# Patient Record
Sex: Female | Born: 1965 | Race: White | Hispanic: No | Marital: Married | Smoking: Never smoker
Health system: Southern US, Community
[De-identification: ages and names within clinical notes are randomized; demographics above are authoritative.]

## PROBLEM LIST (undated history)

## (undated) DIAGNOSIS — I10 Essential (primary) hypertension: Secondary | ICD-10-CM

## (undated) DIAGNOSIS — I491 Atrial premature depolarization: Secondary | ICD-10-CM

## (undated) DIAGNOSIS — I119 Hypertensive heart disease without heart failure: Secondary | ICD-10-CM

## (undated) DIAGNOSIS — C539 Malignant neoplasm of cervix uteri, unspecified: Secondary | ICD-10-CM

## (undated) HISTORY — DX: Atrial premature depolarization: I49.1

## (undated) HISTORY — PX: APPENDECTOMY: SHX54

## (undated) HISTORY — DX: Malignant neoplasm of cervix uteri, unspecified: C53.9

## (undated) HISTORY — DX: Hypertensive heart disease without heart failure: I11.9

## (undated) HISTORY — PX: ABDOMINAL HYSTERECTOMY: SHX81

## (undated) HISTORY — PX: SPIDER VEIN INJECTION: SHX783

## (undated) HISTORY — PX: OOPHORECTOMY: SHX86

## (undated) HISTORY — DX: Essential (primary) hypertension: I10

---

## 1998-07-11 ENCOUNTER — Other Ambulatory Visit: Admission: RE | Admit: 1998-07-11 | Discharge: 1998-07-11 | Payer: Self-pay | Admitting: *Deleted

## 1999-02-07 ENCOUNTER — Other Ambulatory Visit: Admission: RE | Admit: 1999-02-07 | Discharge: 1999-02-07 | Payer: Self-pay | Admitting: *Deleted

## 1999-09-06 ENCOUNTER — Inpatient Hospital Stay (HOSPITAL_COMMUNITY): Admission: AD | Admit: 1999-09-06 | Discharge: 1999-09-06 | Payer: Self-pay | Admitting: *Deleted

## 1999-09-07 ENCOUNTER — Inpatient Hospital Stay (HOSPITAL_COMMUNITY): Admission: AD | Admit: 1999-09-07 | Discharge: 1999-09-09 | Payer: Self-pay | Admitting: Obstetrics and Gynecology

## 1999-10-10 ENCOUNTER — Other Ambulatory Visit: Admission: RE | Admit: 1999-10-10 | Discharge: 1999-10-10 | Payer: Self-pay | Admitting: *Deleted

## 2001-10-05 ENCOUNTER — Other Ambulatory Visit: Admission: RE | Admit: 2001-10-05 | Discharge: 2001-10-05 | Payer: Self-pay | Admitting: *Deleted

## 2002-06-07 ENCOUNTER — Other Ambulatory Visit: Admission: RE | Admit: 2002-06-07 | Discharge: 2002-06-07 | Payer: Self-pay | Admitting: *Deleted

## 2002-09-03 ENCOUNTER — Encounter: Payer: Self-pay | Admitting: Obstetrics and Gynecology

## 2002-09-03 ENCOUNTER — Inpatient Hospital Stay (HOSPITAL_COMMUNITY): Admission: AD | Admit: 2002-09-03 | Discharge: 2002-09-03 | Payer: Self-pay | Admitting: Obstetrics and Gynecology

## 2002-09-06 ENCOUNTER — Ambulatory Visit (HOSPITAL_COMMUNITY): Admission: RE | Admit: 2002-09-06 | Discharge: 2002-09-06 | Payer: Self-pay | Admitting: *Deleted

## 2002-09-06 ENCOUNTER — Encounter (INDEPENDENT_AMBULATORY_CARE_PROVIDER_SITE_OTHER): Payer: Self-pay | Admitting: *Deleted

## 2002-09-13 ENCOUNTER — Inpatient Hospital Stay (HOSPITAL_COMMUNITY): Admission: AD | Admit: 2002-09-13 | Discharge: 2002-09-13 | Payer: Self-pay | Admitting: Obstetrics and Gynecology

## 2003-04-02 ENCOUNTER — Encounter: Payer: Self-pay | Admitting: *Deleted

## 2003-04-02 ENCOUNTER — Inpatient Hospital Stay (HOSPITAL_COMMUNITY): Admission: AD | Admit: 2003-04-02 | Discharge: 2003-04-02 | Payer: Self-pay | Admitting: Obstetrics and Gynecology

## 2003-09-01 ENCOUNTER — Other Ambulatory Visit: Admission: RE | Admit: 2003-09-01 | Discharge: 2003-09-01 | Payer: Self-pay | Admitting: *Deleted

## 2004-09-03 ENCOUNTER — Ambulatory Visit: Payer: Self-pay | Admitting: Internal Medicine

## 2004-09-16 ENCOUNTER — Other Ambulatory Visit: Admission: RE | Admit: 2004-09-16 | Discharge: 2004-09-16 | Payer: Self-pay | Admitting: Obstetrics and Gynecology

## 2004-09-27 ENCOUNTER — Ambulatory Visit: Payer: Self-pay | Admitting: Internal Medicine

## 2004-10-07 ENCOUNTER — Other Ambulatory Visit: Admission: RE | Admit: 2004-10-07 | Discharge: 2004-10-07 | Payer: Self-pay | Admitting: Obstetrics and Gynecology

## 2004-11-06 ENCOUNTER — Ambulatory Visit (HOSPITAL_COMMUNITY): Admission: RE | Admit: 2004-11-06 | Discharge: 2004-11-06 | Payer: Self-pay | Admitting: Obstetrics and Gynecology

## 2004-11-06 ENCOUNTER — Encounter (INDEPENDENT_AMBULATORY_CARE_PROVIDER_SITE_OTHER): Payer: Self-pay | Admitting: *Deleted

## 2004-11-16 ENCOUNTER — Observation Stay (HOSPITAL_COMMUNITY): Admission: AD | Admit: 2004-11-16 | Discharge: 2004-11-17 | Payer: Self-pay | Admitting: Obstetrics and Gynecology

## 2004-11-19 ENCOUNTER — Ambulatory Visit: Admission: RE | Admit: 2004-11-19 | Discharge: 2004-11-19 | Payer: Self-pay | Admitting: Gynecology

## 2004-12-11 ENCOUNTER — Encounter: Admission: RE | Admit: 2004-12-11 | Discharge: 2004-12-11 | Payer: Self-pay | Admitting: Obstetrics and Gynecology

## 2005-03-18 ENCOUNTER — Other Ambulatory Visit: Admission: RE | Admit: 2005-03-18 | Discharge: 2005-03-18 | Payer: Self-pay | Admitting: Gynecology

## 2005-03-18 ENCOUNTER — Ambulatory Visit: Admission: RE | Admit: 2005-03-18 | Discharge: 2005-03-18 | Payer: Self-pay | Admitting: Gynecology

## 2005-03-18 ENCOUNTER — Encounter (INDEPENDENT_AMBULATORY_CARE_PROVIDER_SITE_OTHER): Payer: Self-pay | Admitting: Specialist

## 2005-03-18 ENCOUNTER — Encounter (INDEPENDENT_AMBULATORY_CARE_PROVIDER_SITE_OTHER): Payer: Self-pay | Admitting: *Deleted

## 2005-06-11 ENCOUNTER — Encounter: Admission: RE | Admit: 2005-06-11 | Discharge: 2005-06-11 | Payer: Self-pay | Admitting: Obstetrics and Gynecology

## 2005-07-02 ENCOUNTER — Other Ambulatory Visit: Admission: RE | Admit: 2005-07-02 | Discharge: 2005-07-02 | Payer: Self-pay | Admitting: Obstetrics and Gynecology

## 2005-10-07 ENCOUNTER — Ambulatory Visit: Admission: RE | Admit: 2005-10-07 | Discharge: 2005-10-07 | Payer: Self-pay | Admitting: Gynecologic Oncology

## 2005-10-07 ENCOUNTER — Other Ambulatory Visit: Admission: RE | Admit: 2005-10-07 | Discharge: 2005-10-07 | Payer: Self-pay | Admitting: Gynecologic Oncology

## 2005-10-07 ENCOUNTER — Encounter (INDEPENDENT_AMBULATORY_CARE_PROVIDER_SITE_OTHER): Payer: Self-pay | Admitting: *Deleted

## 2005-12-17 ENCOUNTER — Encounter: Admission: RE | Admit: 2005-12-17 | Discharge: 2005-12-17 | Payer: Self-pay | Admitting: Obstetrics and Gynecology

## 2006-01-08 ENCOUNTER — Other Ambulatory Visit: Admission: RE | Admit: 2006-01-08 | Discharge: 2006-01-08 | Payer: Self-pay | Admitting: Obstetrics and Gynecology

## 2006-02-05 ENCOUNTER — Inpatient Hospital Stay (HOSPITAL_COMMUNITY): Admission: RE | Admit: 2006-02-05 | Discharge: 2006-02-07 | Payer: Self-pay | Admitting: Obstetrics and Gynecology

## 2006-02-05 ENCOUNTER — Encounter (INDEPENDENT_AMBULATORY_CARE_PROVIDER_SITE_OTHER): Payer: Self-pay | Admitting: Specialist

## 2006-05-27 ENCOUNTER — Ambulatory Visit: Admission: RE | Admit: 2006-05-27 | Discharge: 2006-05-27 | Payer: Self-pay | Admitting: Gynecologic Oncology

## 2006-05-27 ENCOUNTER — Encounter (INDEPENDENT_AMBULATORY_CARE_PROVIDER_SITE_OTHER): Payer: Self-pay | Admitting: *Deleted

## 2006-05-27 ENCOUNTER — Other Ambulatory Visit: Admission: RE | Admit: 2006-05-27 | Discharge: 2006-05-27 | Payer: Self-pay | Admitting: Gynecologic Oncology

## 2007-02-16 ENCOUNTER — Other Ambulatory Visit: Admission: RE | Admit: 2007-02-16 | Discharge: 2007-02-16 | Payer: Self-pay | Admitting: Gynecologic Oncology

## 2007-02-16 ENCOUNTER — Ambulatory Visit: Admission: RE | Admit: 2007-02-16 | Discharge: 2007-02-16 | Payer: Self-pay | Admitting: Gynecologic Oncology

## 2007-02-16 ENCOUNTER — Encounter (INDEPENDENT_AMBULATORY_CARE_PROVIDER_SITE_OTHER): Payer: Self-pay | Admitting: Specialist

## 2007-11-24 ENCOUNTER — Ambulatory Visit: Admission: RE | Admit: 2007-11-24 | Discharge: 2007-11-24 | Payer: Self-pay | Admitting: Gynecologic Oncology

## 2007-11-24 ENCOUNTER — Other Ambulatory Visit: Admission: RE | Admit: 2007-11-24 | Discharge: 2007-11-24 | Payer: Self-pay | Admitting: Gynecologic Oncology

## 2007-11-24 ENCOUNTER — Encounter: Payer: Self-pay | Admitting: Gynecologic Oncology

## 2008-03-14 ENCOUNTER — Encounter: Admission: RE | Admit: 2008-03-14 | Discharge: 2008-03-14 | Payer: Self-pay | Admitting: Obstetrics and Gynecology

## 2008-05-10 ENCOUNTER — Other Ambulatory Visit: Admission: RE | Admit: 2008-05-10 | Discharge: 2008-05-10 | Payer: Self-pay | Admitting: Gynecology

## 2008-05-10 ENCOUNTER — Ambulatory Visit: Admission: RE | Admit: 2008-05-10 | Discharge: 2008-05-10 | Payer: Self-pay | Admitting: Gynecology

## 2008-05-10 ENCOUNTER — Encounter: Payer: Self-pay | Admitting: Gynecology

## 2008-07-27 ENCOUNTER — Observation Stay (HOSPITAL_COMMUNITY): Admission: EM | Admit: 2008-07-27 | Discharge: 2008-07-30 | Payer: Self-pay | Admitting: Family Medicine

## 2008-08-03 ENCOUNTER — Emergency Department (HOSPITAL_COMMUNITY): Admission: EM | Admit: 2008-08-03 | Discharge: 2008-08-03 | Payer: Self-pay | Admitting: Emergency Medicine

## 2008-12-18 ENCOUNTER — Ambulatory Visit: Payer: Self-pay | Admitting: Gastroenterology

## 2008-12-18 DIAGNOSIS — K56609 Unspecified intestinal obstruction, unspecified as to partial versus complete obstruction: Secondary | ICD-10-CM | POA: Insufficient documentation

## 2009-03-15 ENCOUNTER — Encounter: Admission: RE | Admit: 2009-03-15 | Discharge: 2009-03-15 | Payer: Self-pay | Admitting: Obstetrics and Gynecology

## 2009-06-07 ENCOUNTER — Other Ambulatory Visit: Admission: RE | Admit: 2009-06-07 | Discharge: 2009-06-07 | Payer: Self-pay | Admitting: Gynecologic Oncology

## 2009-06-07 ENCOUNTER — Ambulatory Visit: Admission: RE | Admit: 2009-06-07 | Discharge: 2009-06-07 | Payer: Self-pay | Admitting: Gynecologic Oncology

## 2009-06-07 ENCOUNTER — Encounter: Payer: Self-pay | Admitting: Gynecologic Oncology

## 2010-03-18 ENCOUNTER — Encounter: Admission: RE | Admit: 2010-03-18 | Discharge: 2010-03-18 | Payer: Self-pay | Admitting: Obstetrics and Gynecology

## 2010-04-03 ENCOUNTER — Telehealth: Payer: Self-pay | Admitting: Gastroenterology

## 2010-04-25 ENCOUNTER — Ambulatory Visit: Payer: Self-pay | Admitting: Gastroenterology

## 2010-04-25 DIAGNOSIS — K921 Melena: Secondary | ICD-10-CM | POA: Insufficient documentation

## 2010-05-12 ENCOUNTER — Emergency Department (HOSPITAL_COMMUNITY): Admission: EM | Admit: 2010-05-12 | Discharge: 2010-05-12 | Payer: Self-pay | Admitting: Family Medicine

## 2010-05-12 ENCOUNTER — Emergency Department (HOSPITAL_COMMUNITY): Admission: EM | Admit: 2010-05-12 | Discharge: 2010-05-12 | Payer: Self-pay | Admitting: Emergency Medicine

## 2010-05-21 ENCOUNTER — Ambulatory Visit: Payer: Self-pay | Admitting: Gastroenterology

## 2010-11-26 NOTE — Letter (Signed)
Summary: [facility] Instructions  [facility]  [address] [place], [place] [address]   Phone: [phone]  Fax: [phone]       PRICSILLA [name]    [birth year]    MRN: [id number]        Procedure Day /Date: Tuesday July 26th, 2011     Arrival Time: 2:30pm     Procedure Time: 3:30pm     Location of Procedure:                    _x _  [facility] (4th Floor)                        PREPARATION FOR COLONOSCOPY WITH MOVIPREP   Starting 5 days prior to your procedure 05/16/10 do not eat nuts, seeds, popcorn, corn, beans, peas,  salads, or any raw vegetables.  Do not take any fiber supplements (e.g. Metamucil, Citrucel, and Benefiber).  THE DAY BEFORE YOUR PROCEDURE         DATE: 05/20/10  DAY: Monday  1.  Drink clear liquids the entire day-NO SOLID FOOD  2.  Do not drink anything colored red or purple.  Avoid juices with pulp.  No orange juice.  3.  Drink at least 64 oz. (8 glasses) of fluid/clear liquids during the day to prevent dehydration and help the prep work efficiently.  CLEAR LIQUIDS INCLUDE: Water Jello Ice Popsicles Tea (sugar ok, no milk/cream) Powdered fruit flavored drinks Coffee (sugar ok, no milk/cream) Gatorade Juice: apple, white grape, white cranberry  Lemonade Clear bullion, consomm, broth Carbonated beverages (any kind) Strained chicken noodle soup Hard Candy                             4.  In the morning, mix first dose of MoviPrep solution:    Empty 1 Pouch A and 1 Pouch B into the disposable container    Add lukewarm drinking water to the top line of the container. Mix to dissolve    Refrigerate (mixed solution should be used within 24 hrs)  5.  Begin drinking the prep at 5:00 p.m. The MoviPrep container is divided by 4 marks.   Every 15 minutes drink the solution down to the next mark (approximately 8 oz) until the full liter is complete.   6.  Follow completed prep with 16 oz of clear liquid of your choice  (Nothing red or purple).  Continue to drink clear liquids until bedtime.  7.  Before going to bed, mix second dose of MoviPrep solution:    Empty 1 Pouch A and 1 Pouch B into the disposable container    Add lukewarm drinking water to the top line of the container. Mix to dissolve    Refrigerate  THE DAY OF YOUR PROCEDURE      DATE: 05/21/10 DAY: Tuesday  Beginning at 10:30 a.m. (5 hours before procedure):         1. Every 15 minutes, drink the solution down to the next mark (approx 8 oz) until the full liter is complete.  2. Follow completed prep with 16 oz. of clear liquid of your choice.    3. You may drink clear liquids until 1:30pm (2 HOURS BEFORE PROCEDURE).   MEDICATION INSTRUCTIONS  Unless otherwise instructed, you should take regular prescription medications with a small sip of water   as early as possible the morning of your  procedure.         OTHER INSTRUCTIONS  You will need a responsible adult at least 43 years of age to accompany you and drive you home.   This person must remain in the waiting room during your procedure.  Wear loose fitting clothing that is easily removed.  Leave jewelry and other valuables at home.  However, you may wish to bring a book to read or  an iPod/MP3 player to listen to music as you wait for your procedure to start.  Remove all body piercing jewelry and leave at home.  Total time from sign-in until discharge is approximately 2-3 hours.  You should go home directly after your procedure and rest.  You can resume normal activities the  day after your procedure.  The day of your procedure you should not:   Drive   Make legal decisions   Operate machinery   Drink alcohol   Return to work  You will receive specific instructions about eating, activities and medications before you leave.    The above instructions have been reviewed and explained to me by   Estill Bamberg.     I fully understand and can verbalize these  instructions _____________________________ Date _________

## 2010-11-26 NOTE — Procedures (Signed)
Summary: Colonoscopy  Patient: Ardath Lepak Note: All result statuses are Final unless otherwise noted.  Tests: (1) Colonoscopy (COL)   COL Colonoscopy           DONE     [facility]     [address]     [place], [place]  [address]           COLONOSCOPY PROCEDURE REPORT           PATIENT:  Natika, Geyer  MR#:  [id number]     BIRTHDATE:  01-Feb-1966, 44 yrs. old  GENDER:  female     ENDOSCOPIST:  Judie Petit T. Russella Dar, MD, [facility]           PROCEDURE DATE:  05/21/2010     PROCEDURE:  Colonoscopy 13244     ASA CLASS:  Class II     INDICATIONS:  1) hematochezia  2) family Hx of polyps     MEDICATIONS:   Fentanyl 75 mcg IV, Versed 9 mg IV     DESCRIPTION OF PROCEDURE:   After the risks benefits and     alternatives of the procedure were thoroughly explained, informed     consent was obtained.  Digital rectal exam was performed and     revealed no abnormalities.   The LB [id number]-[id number] endoscope     was introduced through the anus and advanced to the cecum, which     was identified by both the appendix and ileocecal valve, without     limitations.  The quality of the prep was good, using MoviPrep.     The instrument was then slowly withdrawn as the colon was fully     examined.     <<PROCEDUREIMAGES>>     FINDINGS:  A normal appearing cecum, ileocecal valve, and     appendiceal orifice were identified. The ascending, hepatic     flexure, transverse, splenic flexure, descending, sigmoid colon,     and rectum appeared unremarkable. Retroflexed views in the rectum     revealed no abnormalities. The time to cecum =  7  minutes. The     scope was then withdrawn (time =  11.75  min) from the patient and     the procedure completed.     COMPLICATIONS:  None           ENDOSCOPIC IMPRESSION:     1) Normal colon           RECOMMENDATIONS:     1) Repeat Colonscopy at age 52, 03/2018.           Venita Lick. Russella Dar, MD, [facility]           CC: Harold Hedge, MD           n.  Rosalie DoctorVenita Lick. Keala Drum at 05/21/2010 02:47 PM           Atlee Abide, [id number]  Note: An exclamation mark (!) indicates a result that was not dispersed into the flowsheet. Document Creation Date: 05/21/2010 2:47 PM _______________________________________________________________________  (1) Order result status: Final Collection or observation date-time: 05/21/2010 14:42 Requested date-time:  Receipt date-time:  Reported date-time:  Referring Physician:   Ordering Physician: Claudette Head [phone]) Specimen Source:  Source: Launa Grill Order Number: [id number] Lab site:   Appended Document: Colonoscopy    Clinical Lists Changes  Observations: Added new observation of COLONNXTDUE: 03/2018 (05/21/2010 15:18)

## 2010-11-26 NOTE — Assessment & Plan Note (Signed)
Summary: blood in the stool/Courtney [name]   History of Present Illness Visit Type: Follow-up Visit Primary GI MD: Elie Goody MD [facility] Primary Provider: Harold Hedge, MD Chief Complaint: BRB in toilet for 2-3 days, some contipation prior History of Present Illness:   This is a 45 year old woman who notes a small amount of bright red blood per rectum associated with bowel movements for 2-3 days. Resolved spontaneously. She noted mild constipation prior to the onset of bleeding. She has a strong family history of colon polyps and a personal history of cervical adenocarcinoma and she had been previously recommended to undergo colonoscopy last year.   GI Review of Systems      Denies abdominal pain, acid reflux, belching, bloating, chest pain, dysphagia with liquids, dysphagia with solids, heartburn, loss of appetite, nausea, vomiting, vomiting blood, weight loss, and  weight gain.      Reports constipation and  rectal bleeding.     Denies anal fissure, black tarry stools, change in bowel habit, diarrhea, diverticulosis, fecal incontinence, heme positive stool, hemorrhoids, irritable bowel syndrome, jaundice, light color stool, liver problems, and  rectal pain.   Current Medications (verified): 1)  Vitamin D 500 Iu .... Take 1 Tablet By Mouth Once Daily 2)  Fish Oil 1000 Mg Caps (Omega-3 Fatty Acids) .... Take 1 Tablet By Mouth Once A Day 3)  Multivitamins  Tabs (Multiple Vitamin) .... Take 1 Tablet By Mouth Once A Day 4)  Calcium 500 Mg Tabs (Calcium Carbonate) .... Take 1 Tablet By Mouth Once Daily 5)  Colace 100 Mg Caps (Docusate Sodium) .... Once Daily  Allergies (verified): 1)  ! Sulfa  Past History:  Past Medical History: Cervical adenocarcinoma in situ, 2006 Kidney Stones Small Bowel Obstruction, 07/2008  Past Surgical History: Reviewed history from 12/18/2008 and no changes required. Hysterectomy, 2006 Appendectomy, 2007 Left oophorectomy, 2007  Family History: Reviewed  history from 12/18/2008 and no changes required. Family History of Breast Cancer: Mother No FH of Colon Cancer: Family History of Colon Polyps: Father, Paternal Grandfather  Social History: Reviewed history from 12/18/2008 and no changes required. married Patient has never smoked.  Alcohol Use - yes-5-7 glasses per week Daily Caffeine Use- 1 cup daily Illicit Drug Use - no Patient gets regular exercise.  Review of Systems       The patient complains of allergy/sinus and anxiety-new.  The patient denies anemia, arthritis/joint pain, back pain, blood in urine, breast changes/lumps, change in vision, confusion, cough, coughing up blood, depression-new, fainting, fatigue, fever, headaches-new, hearing problems, heart murmur, heart rhythm changes, itching, menstrual pain, muscle pains/cramps, night sweats, nosebleeds, pregnancy symptoms, shortness of breath, skin rash, sleeping problems, sore throat, swelling of feet/legs, swollen lymph glands, thirst - excessive , urination - excessive , urination changes/pain, urine leakage, vision changes, and voice change.    Vital Signs:  Patient profile:   45 year old female Height:      62 inches Weight:      119 pounds BMI:     21.84 Pulse rate:   60 / minute Pulse rhythm:   regular BP sitting:   110 / 80  (left arm) Cuff size:   regular  Vitals Entered By: June McMurray CMA Duncan Dull) (April 25, 2010 10:34 AM)  Physical Exam  General:  Well developed, well nourished, no acute distress. Head:  Normocephalic and atraumatic. Eyes:  PERRLA, no icterus. Mouth:  No deformity or lesions, dentition normal. Lungs:  Clear throughout to auscultation. Heart:  Regular rate and rhythm;  no murmurs, rubs,  or bruits. Abdomen:  Soft, nontender and nondistended. No masses, hepatosplenomegaly or hernias noted. Normal bowel sounds. Rectal:  Normal exam. hemocult negative.   Extremities:  No clubbing, cyanosis, edema or deformities noted. Neurologic:  Alert and   oriented x4;  grossly normal neurologically. Psych:  Alert and cooperative. Normal mood and affect.  Impression & Recommendations:  Problem # 1:  BLOOD IN STOOL (ICD-578.1) Small-volume hematochezia, which has subsequently resolved. Family history of colon polyps, and a personal history of cervical adenocarcinoma in situ. The risks, benefits and alternatives to colonoscopy with possible biopsy, possible destruction of hemorrhoids and possible polypectomy were discussed with the patient and they consent to proceed. The procedure will be scheduled electively. Orders: Colonoscopy (Colon)  Problem # 2:  FAMILY HISTORY COLONIC POLYPS (ICD-V18.51) Father and paternal grandfather. See problem #1. Orders: Colonoscopy (Colon)  Patient Instructions: 1)  Colonoscopy brochure given.  2)  Pick up your prep from your pharmacy.  3)  Copy sent to : Harold Hedge, MD 4)  The medication list was reviewed and reconciled.  All changed / newly prescribed medications were explained.  A complete medication list was provided to the patient / caregiver.  Prescriptions: MOVIPREP 100 GM  SOLR (PEG-KCL-NACL-NASULF-NA ASC-C) As per prep instructions.  #1 x 0   Entered by:   Christie Nottingham CMA (AAMA)   Authorized by:   Meryl Dare MD [facility]   Signed by:   Meryl Dare MD [facility] on 04/25/2010   Method used:   Electronically to        [facility] (retail)       [address]       [place], [place]  [address]       Ph: [phone] or [phone]       Fax: [phone]   RxID:   [id number]

## 2010-11-26 NOTE — Progress Notes (Signed)
Summary: triage  Phone Note Call from Patient Call back at [phone]   Caller: Patient Call For: Dr. Russella Dar Reason for Call: Talk to Nurse Summary of Call: pt has noticed a small amount of blood on stool and doesnt feel comfortable waiting until next available appt in August... ok to see Amy or Gunnar Fusi if needed Initial call taken by: Vallarie Mare,  April 03, 2010 9:09 AM  Follow-up for Phone Call        patient has notified bright red blood on the outside of her stool.  She does states she has been constipated.  I have scheduled her for an appointment with Dr Russella Dar for 04/25/10 at 9:30, I have also advised her to start on a stool softener and a high fiber diet. Follow-up by: Darcey Nora RN, CGRN,  April 03, 2010 9:40 AM

## 2011-01-11 LAB — CBC
HCT: 38.3 % (ref 36.0–46.0)
MCV: 98.1 fL (ref 78.0–100.0)
RBC: 3.9 MIL/uL (ref 3.87–5.11)
RDW: 12.4 % (ref 11.5–15.5)

## 2011-01-11 LAB — COMPREHENSIVE METABOLIC PANEL
ALT: 12 U/L (ref 0–35)
CO2: 22 mEq/L (ref 19–32)
GFR calc Af Amer: >60 mL/min (ref >60)
Total Protein: 7.3 g/dL (ref 6.0–8.3)

## 2011-01-11 LAB — POCT CARDIAC MARKERS
Myoglobin, poc: 54.9 ng/mL (ref 12–200)
Troponin i, poc: <0.05 ng/mL (ref 0.00–0.09)

## 2011-01-11 LAB — DIFFERENTIAL
Basophils Absolute: 0 10*3/uL (ref 0.0–0.1)
Basophils Relative: 0 % (ref 0–1)
Eosinophils Relative: 1 % (ref 0–5)
Monocytes Absolute: 0.5 10*3/uL (ref 0.1–1.0)
Monocytes Relative: 6 % (ref 3–12)

## 2011-01-27 ENCOUNTER — Ambulatory Visit (INDEPENDENT_AMBULATORY_CARE_PROVIDER_SITE_OTHER): Payer: Managed Care, Other (non HMO) | Admitting: Professional

## 2011-01-27 DIAGNOSIS — F4322 Adjustment disorder with anxiety: Secondary | ICD-10-CM

## 2011-02-03 ENCOUNTER — Ambulatory Visit (INDEPENDENT_AMBULATORY_CARE_PROVIDER_SITE_OTHER): Payer: Managed Care, Other (non HMO) | Admitting: Professional

## 2011-02-03 DIAGNOSIS — F4322 Adjustment disorder with anxiety: Secondary | ICD-10-CM

## 2011-03-03 ENCOUNTER — Other Ambulatory Visit: Payer: Self-pay | Admitting: Obstetrics and Gynecology

## 2011-03-03 DIAGNOSIS — Z1231 Encounter for screening mammogram for malignant neoplasm of breast: Secondary | ICD-10-CM

## 2011-03-11 NOTE — H&P (Signed)
Courtney [name], Courtney [name]              ACCOUNT NO.:  [id number]   MEDICAL RECORD NO.:  [id number]          PATIENT TYPE:  OBV   LOCATION:  5148                         FACILITY:  [facility]   PHYSICIAN:  Courtney [name], M.D.DATE OF BIRTH:  24-Dec-1965   DATE OF ADMISSION:  07/27/2008  DATE OF DISCHARGE:                              HISTORY & PHYSICAL   PRIMARY CARE PHYSICIAN:  Courtney Sandifer. Henderson Cloud, MD   REQUESTING PHYSICIAN:  Courtney Levan. Bernette Mayers, MD in the ER.   SURGEON:  Courtney Fus A. Cornett, MD   TIME OF ADMISSION:  15:25 p.m.   CHIEF COMPLAINT:  Left lower quadrant abdominal pain.   HISTORY OF PRESENT ILLNESS:  Courtney [name] is a 45 year old white female who  has a history of cervical adenocarcinoma who has had a partial  hysterectomy laparoscopically as well as eventually a left salpingo-  oophorectomy and appendectomy due to left ovarian torsion who presents  to the emergency room today with left lower quadrant abdominal pain.  She states last night she began having intermittent abdominal pain in  her left lower quadrant.  She states that she did not have any  associated nausea or vomiting at that time.  However, when the patient  woke up this morning, she had worsening abdominal pain and at that time  had one episode of emesis due to nausea.  She states that she did have a  normal bowel movement this morning which was of normal consistency and  did not have any blood in it.  She states that she did have flatus last  night, however, does not recall any today.  She also states that she is  somewhat bloated.  At the time of presentation to the emergency  department because the patient does have a history of kidney stones,  they felt that this may be related to a kidney stone and therefore a CT  of the abdomen and pelvis was completed.  However, no IV or oral  contrast was used.  At this time, the CT appears to show a probable  partial small bowel obstruction.  Because of the CT findings, we  were  called down to see the patient.   REVIEW OF SYSTEMS:  Please see HPI, otherwise, all other systems are  negative.   FAMILY HISTORY:  Noncontributory.   PAST MEDICAL HISTORY:  1. Cervical adenocarcinoma, which has been removed.  2. Kidney stones.   PAST SURGICAL HISTORY:  The patient has had several OB/GYN surgeries  including a robotic laparoscopic partial hysterectomy as well as an  exploratory laparotomy for a left salpingo-oophorectomy, and  appendectomy due to a left ovarian torsion.  She has also had a D&C, I  believe, for miscarriage.   SOCIAL HISTORY:  The patient is married and does have 2 children.  She  is a nonsmoker and takes alcohol.   ALLERGIES:  SULFA.   MEDICATIONS:  Multivitamin.   PHYSICAL EXAMINATION:  GENERAL:  Courtney [name] is a 45 year old white female  who is very pleasant and currently sitting up in bed, in no acute  distress as she did receive a dose  of pain medicine prior to my arrival.  VITAL SIGNS:  Temperature 97.9, pulse 71, respirations 20, and blood  pressure 129/85.  HEENT:  Sclerae noninjected.  Pupils are equal, round, and reactive to  light.  Ears, nose, mouth, and throat:  Ears and nose without any  obvious masses or lesions.  No rhinorrhea.  Mouth is pink and moist.  Throat shows no exudate.  NECK:  Supple.  Trachea is midline.  No thyromegaly.  HEART:  Regular rate and rhythm.  Normal S1 and S2.  No murmurs,  gallops, or rubs were noted.  +2 radial, pedal, and carotid pulses  bilaterally.  LUNGS:  Clear to auscultation bilaterally with no wheezes, rhonchi, or  rales noted.  Respiratory effort is nonlabored.  CHEST:  Symmetrical.  ABDOMEN:  Soft with some mild tenderness in her left mid and left lower  quadrant.  She does not have any guarding.  No rebound tenderness is  noted. Otherwise, the patient has hypoactive bowel sounds and very  minimal distention.  She does not have any peritoneal signs or masses or  hernias noted.   MUSCULOSKELETAL:  All 4 extremities are symmetrical without any  cyanosis, clubbing, or edema.  SKIN:  Warm and dry without any obvious masses or rashes.  NEURO:  Cranial nerves II through XII appear to be grossly intact.  PSYCH:  The patient is alert and oriented x3 with an appropriate affect.   LABORATORY DATA:  Urinalysis is normal.  CBC reveals a white blood cell  count of 12,400, hemoglobin of 14.3, hematocrit of 41.8, platelets of  450,000, and an neutrophil count of 84%.  Otherwise, at this time, other  labs are pending including a CMET.   DIAGNOSTICS:  CT scan of the abdomen and pelvis reveals a probable  partial small bowel obstruction.   IMPRESSION:  1. Partial small bowel obstruction.  2. History of cervical adenocarcinoma.  3. History of kidney stones.   PLAN:  At this time, we will admit Courtney [name] for observation of her  partial small bowel obstruction.  At this time, she will be put on bowel  rest except for ice chips and various p.r.n. medications if needed.  We  will observe her and watch for improvement.  If the patient begins to  improve and has less abdominal pain, less bloating, passing gas, having  bowel movements without any nausea or vomiting, her diet may be advanced  at a later date and eventually sent home.  However, we will watch the  patient if she begins to worsen with increasing abdominal pain, nausea,  and vomiting, she may end up with an NG tube.  If she begins to start  passing flatus and having bowel movements as well as the aforementioned  symptoms, the patient may eventually need surgical intervention if her  partial small bowel obstruction does not improve.  Otherwise at this  time, the patient may have various p.r.n. p.o. or IV  medications for pain as well as nausea.  We will obtain repeat abdominal  x-rays in the morning as well as repeat laboratory data.  At this time,  I have discussed this case with Dr. Luisa [name] as he has reviewed the   patient's CT of the abdomen and pelvis with me as well.      Courtney Cape, PA      Courtney [name], M.D.  Electronically Signed    KEO/MEDQ  D:  07/27/2008  T:  07/28/2008  Job:  [id number]  cc:   Courtney [name], M.D.

## 2011-03-11 NOTE — Consult Note (Signed)
NAMEERYNNE, Courtney NO.:  [id number]   MEDICAL RECORD NO.:  [id number]          PATIENT TYPE:  OUT   LOCATION:  GYN                          [facility]ACILITY[facility]  [facility]   PHYSICIAN:  De Blanch, M.D.DATE OF BIRTH:  1966/03/20   DATE OF CONSULTATION:  05/10/2008  DATE OF DISCHARGE:                                 CONSULTATION   CHIEF COMPLAINT:  Cervical cancer.   INTERVAL HISTORY:  Since her last visit, the patient has done well.  She  denies any GI or GU symptoms, has no pelvic pain, pressure, vaginal  bleeding or discharge.  She has a number questions about PMS.  She has  one residual ovary and is really not having any symptoms except for  breast engorgement and tenderness.  Functional status is excellent.   HISTORY OF PRESENT ILLNESS:  The patient has stage IB adenocarcinoma of  the cervix undergoing a robotic radical hysterectomy and pelvic  lymphadenectomy January 2006.  The patient had no residual disease in  the cervix and negative lymph nodes and has been followed since then  without any evidence of disease.   The patient unfortunately had an ovarian torsion subsequent to her  surgery, undergoing a laparotomy and right salpingo-oophorectomy and  appendectomy.  She does have residual left ovary.   PAST MEDICAL HISTORY:  Medical illnesses:  None.   OBSTETRICAL HISTORY:  Gravida 2.   SOCIAL HISTORY:  The patient is married.  She does not smoke.   FAMILY HISTORY:  Negative for gynecologic, breast or colon cancer.   ALLERGIES:  SULFA (RASH).   REVIEW OF SYSTEMS:  A 10-point coverage review of systems negative  except as noted above.   PHYSICAL EXAMINATION:  Weight 122 pounds.  GENERAL:  The patient is a healthy white female in no acute distress.  HEENT:  Is negative.  NECK:  Supple without thyromegaly.  There is no supraclavicular or  inguinal adenopathy.  The abdomen is soft, nontender.  No masses, organomegaly, ascites or  hernias noted.  Her  Pfannenstiel incision is well-healed as are her  laparoscopic scars.  There is no CVA tenderness.  PELVIC EXAM:  EGBUS, vagina, bladder and urethra are normal.  Cervix and  uterus surgically absent.  No lesions noted.  Bimanual and rectovaginal  exam reveal no masses, induration or nodularity.   IMPRESSION:  Stage IB adenocarcinoma of cervix.  No evidence of  recurrent disease.  Pap smears were obtained.  The patient returns see  Dr. Henderson Cloud for annual exam in December and return to see [place] in 1 year.      De Blanch, M.D.  Electronically Signed     DC/MEDQ  D:  05/10/2008  T:  05/10/2008  Job:  [id number]   cc:   Guy Sandifer. Henderson Cloud, M.D.  Fax: [phone]   Telford Nab, R.N.  [address]  [place], [place] [address]

## 2011-03-11 NOTE — Consult Note (Signed)
NAMEGRACIEANN, STANNARD NO.:  [id number]   MEDICAL RECORD NO.:  [id number]          PATIENT TYPE:  OUT   LOCATION:  GYN                          FACILITY:  [facility]   PHYSICIAN:  Laurette Schimke, MD     DATE OF BIRTH:  May 08, 1966   DATE OF CONSULTATION:  06/07/2009  DATE OF DISCHARGE:                                 CONSULTATION   REASON FOR VISIT:  Surveillance for stage 1D cervical adenocarcinoma.   HISTORY OF PRESENT ILLNESS:  This is a 45 year old who in February 2006  underwent a robotic hysterectomy with bilateral ovarian transposition.  None of the lymph nodes were positive and there were no other factors  that would provide an indication for adjuvant therapy.  In April 2007,  she underwent left ovarian torsion and underwent a left salpingo-  oophorectomy and appendectomy.  All Pap smears since her definitive  surgical procedure have been within normal limits.  She notes an episode  of small bowel obstruction recently that resolved.   PAST MEDICAL HISTORY:  No interval changes.   PAST SURGICAL HISTORY:  No interval changes.   OBSTETRICAL HISTORY:  Gravida 2.   SOCIAL HISTORY:  She denies any tobacco use.   FAMILY HISTORY:  No interval changes.   REVIEW OF SYSTEMS:  A 10-point review of systems was performed and was  negative.   PHYSICAL EXAMINATION:  VITAL SIGNS:  Weight 115 pounds, blood pressure  120/78.  CONSTITUTIONAL:  Well-developed female in no acute distress.  CHEST:  Clear to auscultation.  ABDOMEN:  Soft, nontender.  No palpable masses or distension.  PELVIC:  Normal external genitalia, Bartholin's, urethra, and Skene's.  No parametrial thickening.  No nodularity.  Rectovaginal examination  unremarkable.   IMPRESSION:  Ms. Porche remains without any evidence of disease 4-1/2  years from her radical hysterectomy and pelvic lymph node dissection and  ovarian transposition.  She will follow up with Dr. Henderson Cloud in January  of 2011  and then  all subsequent visits will be with Dr. Henderson Cloud.  I have assured  her that if there are any bumps or any need for gyn oncology  consultation, he would be available.   ADDENDUM:  Ms. Keltz was informed that we will provide her with the  results of her Pap test within 2 weeks.      Laurette Schimke, MD  Electronically Signed     WB/MEDQ  D:  06/07/2009  T:  06/07/2009  Job:  [id number]   cc:   Guy Sandifer. Henderson Cloud, M.D.  Fax: [phone]   Telford Nab, R.N.  [address]  [place], [place] [address]

## 2011-03-11 NOTE — Consult Note (Signed)
NAMEJESSI, Courtney [name]              ACCOUNT NO.:  [id number]   MEDICAL RECORD NO.:  [id number]          PATIENT TYPE:  OUT   LOCATION:  GYN                          FACILITY:  [facility]   PHYSICIAN:  Paola A. Duard Brady, MD    DATE OF BIRTH:  Nov 02, 1965   DATE OF CONSULTATION:  11/24/2007  DATE OF DISCHARGE:                                 CONSULTATION   Courtney [name] is a very pleasant 45 year old who underwent robotic  hysterectomy for stage IB adenocarcinoma of the cervix in January 2006.  Final pathology was this is most consistent with no residual tumor in  her cervix and negative lymph nodes 0/31. She underwent oophoropexy.  She was seen by me in April 2008 and was seen by Dr. Henderson Cloud in August  2008 with a negative Pap smear and was subsequently also seen by him in  December for her annual examination.  He did do a pelvic exam, but did  not do a Pap smear at that time. Breast exam was negative.  She comes in  today for follow-up.  She is overall doing quite well.  She thinks she  might have pulled a muscle in her left side this weekend as she was  moving some boxes in her attic.   REVIEW OF SYSTEMS:  She has a negative review of systems.  She denies  any chest pain, shortness of breath, nausea, vomiting, fevers, chills,  any headaches, visual changes vaginal bleeding, change in bowel or  bladder habits.   PHYSICAL EXAMINATION:  VITAL SIGNS:  Weight 121 pounds, blood pressure  118/78.  GENERAL:  Well-nourished, alert female in no acute distress.  NECK:  Supple with no lymphadenopathy, no thyromegaly.  LUNGS:  Lungs were clear to auscultation bilaterally.  HEART:  Cardiovascular exam regular rate and rhythm.  ABDOMEN:  Shows well-healed skin incisions.  She has a well-healed  transverse skin incision.  Abdomen is soft, nontender, nondistended with  no palpable masses or hepatosplenomegaly.  Groins are negative for  adenopathy.  EXTREMITIES:  She was no edema.  PELVIC:  External  genitalia is within normal limits.  Vagina is well  epithelized.  The vaginal cuff is visualized.  There are no visible  lesions.  ThinPrep Pap was submitted without difficulty.  Bimanual  examination with no masses or nodularity.  Rectal confirms.  The mucosa  is smooth.   ASSESSMENT:  This is a 45 year old with a stage I B adenocarcinoma  cervix been free of disease for the past 3 years.  She was,  congratulated on her excellent pathology and outcome thus far.  She will  return to see [place] in 6  months and then follow up with Dr. Henderson Cloud in December for her annual  examination.  This schedule seems to work best for her so she can keep  on track with her annual examinations with Dr. Henderson Cloud.  We will follow  up on results for Pap smear from today.      Paola A. Duard Brady, MD  Electronically Signed     PAG/MEDQ  D:  11/24/2007  T:  11/25/2007  Job:  [id number]   cc:   Guy Sandifer. Henderson Cloud, M.D.  Fax: [phone]   Telford Nab, R.N.  [address]  [place], [place] [address]

## 2011-03-14 NOTE — Consult Note (Signed)
NAMEDUNG, PRIEN NO.:  [id number]   MEDICAL RECORD NO.:  [id number]          PATIENT TYPE:  OUT   LOCATION:  GYN                          [facility]ACILITY:  [facility]   PHYSICIAN:  De Blanch, M.D.DATE OF BIRTH:  01/14/1966   DATE OF CONSULTATION:  03/18/2005  DATE OF DISCHARGE:  03/18/2005                                   CONSULTATION   HISTORY OF PRESENT ILLNESS:  This 45 year old white female returns for  continuing follow-up, having undergone a robotically assisted radical  hysterectomy and pelvic lymphadenectomy by Dr. Laurence Aly, at [facility] on  December 19, 2004.  The patient had an uncomplicated postoperative course.  Final pathology showed no residual cancer in the cervix and all lymph nodes  were free of disease (31 lymph nodes).  The patient reports that she is  doing well.  She has some slight change in bladder sensation but is emptying  well.  She denies any dysuria, is not having any particular other symptoms.  Bowel movements are normal.   PAST MEDICAL HISTORY:   MEDICAL ILLNESSES:  None.   PAST SURGICAL HISTORY:  1.  D&E after retained placenta.  2.  Radical hysterectomy (robotically assisted).   CURRENT MEDICATIONS:  None.   ALLERGIES:  Drug allergies:  SULFA.   SOCIAL HISTORY:  The patient is married.  She does not smoke.  She has two  children.   FAMILY HISTORY:  Negative for gynecologic, breast, or colon cancer.   REVIEW OF SYSTEMS:  Negative except as noted above.   PHYSICAL EXAMINATION:  VITAL SIGNS:  Weight 115 pounds, blood pressure  118/68.  GENERAL:  In general the patient is a healthy, slender white female in no  acute distress.  HEENT:  Negative.  NECK:  Supple without thyromegaly.  There is no supraclavicular or inguinal  adenopathy.  ABDOMEN:  Soft and nontender.  No masses or organomegaly, ascites, or  hernias are noted.  All incisions in the upper abdomen are well-healed.  PELVIC:  EGBUS, vagina, bladder,  urethra are normal except for some  granulation tissue at the right of the vaginal cuff.  This is removed with  forceps and silver nitrate supplied.  Bimanual and rectovaginal exam reveal  no masses, induration, or nodularity.   IMPRESSION:  Status post radical hysterectomy for stage IB1 adenocarcinoma  of the cervix.   PLAN:  The patient's Pap smears were obtained today.  She returns to Dr. Thressa Sheller in three months, returns to see [place] in six months.       DC/MEDQ  D:  03/30/2005  T:  03/30/2005  Job:  [id number]   cc:   Guy Sandifer. Henderson Cloud, M.D.  [address]  [place]  [place]  [place] [address]  Fax: [phone]   Stark Jock, M.D.  [facility] [address] [facility] [place]  [place], [place]  [address]   Telford Nab, R.N.  [phone] [address]  [place], [place] [address]

## 2011-03-14 NOTE — Consult Note (Signed)
NAMECLOTIEL, TROOP              ACCOUNT NO.:  [id number]   MEDICAL RECORD NO.:  [id number]          PATIENT TYPE:  OUT   LOCATION:  GYN                          FACILITY:  [facility]   PHYSICIAN:  Paola A. Duard Brady, MD    DATE OF BIRTH:  Jan 08, 1966   DATE OF CONSULTATION:  10/07/2005  DATE OF DISCHARGE:                                   CONSULTATION   Ms. Hoefer is a very pleasant 45 year old who was diagnosed with stage IB  cervical carcinoma and underwent a robotic assisted radical hysterectomy and  pelvic lymphadenectomy at [facility] December 19, 2004. Final pathology was  consistent with negative lymph nodes, 0/31 and no residual cancer within the  cervix. She did well postoperatively and has had no immediate postoperative  sequelae. She overall is doing quite well. She states that she feels great  for about 10 weeks and then gets quite nervous and worked up the week before  and the week after her evaluations. She otherwise denies any complaints. She  denies any dyspareunia, any vaginal bleeding, any change in her bowel or  bladder habits. She was last seen by Dr. Henderson Cloud September 6. At that time,  her Pap smear was unremarkable. In addition, it was recommended that she  have a 6 month followup with benign calcifications in her breast. She is  overall doing quite well and denies any complaints.   PAST MEDICAL HISTORY:  As above.   PAST SURGICAL HISTORY:  D&E, radical hysterectomy.   ALLERGIES:  SULFA.   PHYSICAL EXAMINATION:  VITAL SIGNS:  Weight 119 pounds, blood pressure  118/68.  GENERAL:  Well-nourished, well-developed female in no acute distress.  NECK:  Supple. There is no lymphadenopathy, no thyromegaly.  LUNGS:  Clear to auscultation bilaterally.  CARDIOVASCULAR:  Regular rate and rhythm.  ABDOMEN:  She has a well laparoscopy incision. Abdomen is soft, nontender,  nondistended, there are no palpable masses, hepatosplenomegaly. There is no  hernias.  GROINS:  Negative for  adenopathy.  EXTREMITIES:  There is no edema.  PELVIC:  External  genitalia is within normal limits. The vagina is well  epithelialized. The vaginal cuff is visualized, there are no visible  lesions. A thin prep Pap was submitted without difficulty. Bimanual  examinations reveals no masses or nodularity. Rectal confirms.   ASSESSMENT:  A 45 year old status post robotic radical hysterectomy for a  stage IB adenocarcinoma of the cervix with no evidence of recurrent disease.   PLAN:  1.  Will followup on the results of her Pap smear from today. She will      return to see Dr. Henderson Cloud in 3 months      which will be her 1 year anniversary and then followup with [place] 4 months      after that. For the next 2 years will follow her every 4 months      alternating between our office and Dr. Huel Coventry. After 3 years, she      will have q.6 month visits. This followup was discussed with the      patient.      Rejeana Brock  Bernie Covey, MD  Electronically Signed     PAG/MEDQ  D:  10/07/2005  T:  10/08/2005  Job:  [id number]   cc:   Guy Sandifer. Henderson Cloud, M.D.  Fax: [phone]   Dr. Jonny Ruiz __________   Telford Nab, R.N.  [address]  [place], [place] [address]

## 2011-03-14 NOTE — Op Note (Signed)
   NAME:  Courtney [name], Courtney [name]                        ACCOUNT NO.:  [id number]   MEDICAL RECORD NO.:  [id number]                   PATIENT TYPE:  AMB   LOCATION:  SDC                                  FACILITY:  [facility]   PHYSICIAN:  Tracie Harrier, M.D.              DATE OF BIRTH:  [birth year]   DATE OF PROCEDURE:  09/06/2002  DATE OF DISCHARGE:  09/06/2002                                 OPERATIVE REPORT   PREOPERATIVE DIAGNOSES:  Retained products of conception.   POSTOPERATIVE DIAGNOSES:  Retained products of conception.   PROCEDURE:  Dilatation and evacuation.   SURGEON:  Tracie Harrier, M.D.   ANESTHESIA:  MAC.   ESTIMATED BLOOD LOSS:  50 cc.   COMPLICATIONS:  None.   FINDINGS:  Products of conception, small.   PROCEDURE:  The patient was taken to the operating room where a MAC  anesthetic was administered.  The patient was placed on the operating table  in the dorsal lithotomy position.  The perineum and vagina was prepped and  draped in the usual sterile fashion with Betadine and sterile drapes.  A  speculum was placed and the anterior lip of the cervix was grasped with a  single tooth tenaculum.  The cervix was noted to be quite dilated.  Next, a  number 7 curved suction curette was introduced into the intrauterine cavity  and the uterus was emptied in a systematic fashion.  Several passes were  made throughout the uterus using the suction curette with products of  conception aspirated.  This was a small amount, however.  After about four  passes with the suction curette the uterus was sharply curetted with a small  serrated curette and noted to be empty.  Two final passes were then remade  and the uterus was noted to be empty.  All vaginal instruments were then  removed.  The patient was awakened and taken to recovery room in good  condition.  There were no perioperative complications.                                               Tracie Harrier, M.D.    REG/MEDQ   D:  09/13/2002  T:  09/13/2002  Job:  [id number]

## 2011-03-14 NOTE — H&P (Signed)
Courtney [name], Courtney [name]              ACCOUNT NO.:  [id number]   MEDICAL RECORD NO.:  [id number]         PATIENT TYPE:  AMB   LOCATION:  SDC                           FACILITY:  [facility]   PHYSICIAN:  Duke Salvia. Marcelle Overlie, M.D.DATE OF BIRTH:  04-01-1966   DATE OF ADMISSION:  11/16/2004  DATE OF DISCHARGE:                                HISTORY & PHYSICAL   CHIEF COMPLAINT:  Postop bleeding.   HISTORY OF PRESENT ILLNESS:  [age]-year-old G4, P2 who underwent CKC  here at [facility] November 06, 2004.  This was done secondary to adenocarcinoma in  situ.  She was doing well until today when she was at a movie and noticed  some increased bleeding.  When seen in MAU attempts at packing, Monsel's,  observation showed continued bleeding, decision made to take her to the  operating room for resuturing.   PAST MEDICAL HISTORY:  HSV with rare outbreaks.  She has had three vaginal  deliveries and one TAB for trisomy 61.   FAMILY HISTORY:  Heart disease in the maternal grandfather, chronic  hypertension in maternal grandfather, and a history of alcohol addiction in  paternal uncles.   PHYSICAL EXAMINATION:  VITALS:  Temperature 97.8, pulse 80, respirations 20,  BP 163/73.  HEENT:  Unremarkable.  NECK:  Supple without mass.  LUNGS:  Clear.  CARDIOVASCULAR:  Regular rate and rhythm without murmurs, rubs, or gallops.  BREASTS:  Not examined.  ABDOMEN:  Soft, flat, nontender.  PELVIC EXAM:  Vulva and vagina revealed some old blood, some of the cervical  cone sutures were disrupted, there was some bleeding from the bed that was  difficult to identify but again with packing and Monsel's did not slow down  adequately.  EXTREMITIES/NEUROLOGIC EXAM:  Unremarkable.   LABORATORIES:  Hemoglobin was 12.6   IMPRESSION:  Ten days postoperative cold-knife conization with bleeding.   PLAN:  We will take her to the operating room for evaluation and resuturing  of the cone bed, this was discussed with the patient  in which she  understands.     Rich   RMH/MEDQ  D:  11/16/2004  T:  11/16/2004  Job:  [id number]

## 2011-03-14 NOTE — Discharge Summary (Signed)
Courtney [name], Courtney [name]              ACCOUNT NO.:  [id number]   MEDICAL RECORD NO.:  [id number]          PATIENT TYPE:  INP   LOCATION:  9310                          FACILITY:  [facility]   PHYSICIAN:  Guy Sandifer. Henderson Cloud, M.D. DATE OF BIRTH:  [birth year]   DATE OF ADMISSION:  02/05/2006  DATE OF DISCHARGE:  02/07/2006                                 DISCHARGE SUMMARY   ADMITTING DIAGNOSIS:  Left ovarian cyst.   DISCHARGE DIAGNOSIS:  Left tubo-ovarian torsion.   PROCEDURE:  On February 05, 2006, exploratory laparotomy with left salpingo-  oophorectomy, appendectomy, laparoscopy and cystoscopy.   REASON FOR ADMISSION:  The patient is a 45 year old married white female  status post radical hysterectomy one year prior to admission for cervical  adenocarcinoma.  She had acute onset of pain six days prior.  A left adnexal  mass measuring approximately 6 cm was noted.  Details are dictated in the  history and physical and the operative note.   HOSPITAL COURSE:  The patient is taken to the operating room and undergoes  the above procedures.  On the evening of surgery, she has good pain relief.  Vital signs are stable, and she is afebrile.  On the first postoperative  day, she complains of some pain which was relieved with Toradol.  She is  ambulating well.  Vital signs remained stable, and she is afebrile.  Hemoglobin was 9.5, white count of 7.2 and final pathology is pending.  On  the day of discharge, she is tolerating regular diet and is without  complaint.  Vital signs remained stable and afebrile.   CONDITION ON DISCHARGE:  Good.   DIET:  Regular as tolerated.   ACTIVITY:  No lifting, no operation of automobiles.   She is to call the office for problems including but not limited to  temperature of 101 degrees, increasing pain or persistent nausea and  vomiting.   MEDICATIONS:  1.  Ibuprofen 6 mg q.6h. p.r.n.  2.  Vicodin #30 one to two p.o. q.6h. p.r.n.   FOLLOW-UP:  Is in the office in  two weeks.     Guy Sandifer Henderson Cloud, M.D.  Electronically Signed    JET/MEDQ  D:  03/17/2006  T:  03/17/2006  Job:  [id number]

## 2011-03-14 NOTE — Op Note (Signed)
NAMECELIE, DESROCHERS              ACCOUNT NO.:  [id number]   MEDICAL RECORD NO.:  [id number]           PATIENT TYPE:   LOCATION:                                 FACILITY:   PHYSICIAN:  Guy Sandifer. Henderson Cloud, M.D.      DATE OF BIRTH:   DATE OF PROCEDURE:  02/05/2006  DATE OF DISCHARGE:                                 OPERATIVE REPORT   PREOPERATIVE DIAGNOSIS:  Left adnexal cyst.   POSTOPERATIVE DIAGNOSIS:  Left tubal ovarian torsion.   PROCEDURES:  1.  Exploratory laparotomy with left salpingo-oophorectomy.  2.  Appendectomy.  3.  Laparoscopy.   SURGEON:  Guy Sandifer. Henderson Cloud, M.D.   ASSISTANT:  Duke Salvia. Marcelle Overlie, M.D.   ANESTHESIA:  General endotracheal intubation.   ESTIMATED BLOOD LOSS:  Less than 100 mL.   SPECIMENS:  Left tube, left ovary, and appendix.   INDICATIONS AND CONSENT:  This patient is a 45 year old married white female  status post radical hysterectomy approximately 1 year ago for cervical  adenocarcinoma.  She had the acute onset of pain 6 days ago.  This was  evaluated in [place], [place] with CAT scan and ultrasound x2.  They were suspicious of ovarian torsion.  The patient's pain improved; and  the patient was discharged home.  She presented to my office 2 days ago.  She complained of some pain in the left adnexa that waxed and waned although  not as severe as it was on Friday night.  No nausea or vomiting.  She  reported a temperature of 100.3, the night prior to her presentation in the  office, but was afebrile in the office.  No nausea or vomiting with normal  bowel and bladder function.  Ultrasound in my office confirmed a 6.1 cm left  adnexal hemorrhagic-type cyst.  Laparoscopy, possible left ovarian  cystectomy, possible left salpingo-oophorectomy was discussed with the  patient.  The potential risks and complications were discussed with the  patient preoperatively, in the office, as well as in the hospital  preoperatively including but  limited to:  Infection, bowel, bladder,  ureteral damage, bleeding with possible transfusion of blood products with  possible transfusion reaction, HIV and hepatitis acquisition, DVT, PE,  laparotomy.  All questions were answered and consent is signed on the chart.   FINDINGS:  The upper abdomen is grossly normal.  Peritoneal surfaces are  smooth.  In the pelvis the left adnexa is completely covered with sigmoid.  The left ovary and tube were found to be enlarged, filled with clot.  The  left ovary is partially ruptured.  The right tube and ovary were normal.  The remainder of the pelvis appears normal.   DESCRIPTION OF PROCEDURE:  The patient is taken to operating room where she  is identified, placed in the dorsal supine position and general anesthesia  is induced via endotracheal intubation.  She is then placed in dorsal  lithotomy position where she is prepped, bladder is straight catheterized;  and she is draped in a sterile fashion.  The infraumbilical and suprapubic  areas are infiltrated with 1/2% Marcaine.  A small infraumbilical incision  is made; and a disposable Veress needle is placed in the abdominal cavity  with a normal syringe and drop test.  Two liters of gas are insufflated  under low pressure with good tympany in the right upper quadrant.  Veress  needle is removed and a 10/11 disposable bladeless XL trocar sleeve is  placed using direct visualization using the diagnostic laparoscopic.   This is then switched through the operative laparoscopic; and a small  suprapubic incision is made; and a 5-mm XL bladeless trocar sleeve is placed  under direct visualization without difficulty.  Later similar procedure is  carried out in the left lower quadrant for a second 5-mm XL trocar sleeve.  The above findings are noted.  With a degree of adhesion and inflammation;  it is felt that to proceed laparoscopically would put the patient at  increased risk for complications;  therefore, the decision is made to proceed  with laparotomy.  The inferior trocar sleeves are removed followed by the  umbilical trocar sleeve.  The skin of the umbilical incision is closed with  2-0 Vicryl suture.  Dermabond is applied to the umbilical and left lower  quadrant incisions.  Foley catheter is placed in the bladder.   A Pfannenstiel incision is made incorporating the suprapubic incision.  Dissection is carried out in layers to the peritoneum.  Peritoneum is  incised and extended superiorly and inferiorly.  It should be noted that  upon entry into the abdomen with the laparoscopic washings were taken and  sent for cytology.  The O'Sullivan-O'Conner retractor is placed; and the  bladder blade is placed.  The bowel is packed away; and the superior blade  is placed.  The right ovary is noted to be normal.  The adhesions  surrounding the left ovary are taken down bluntly.  The hemorrhage and  infarction extends along the left infundibulopelvic ligament to the point of  torsion.  The tube and ovary otherwise lift away from the pelvic sidewall  with gentle blunt dissection.  This leaves the sidewall intact, although it  his indurated.   The left infundibulopelvic ligament is then clamped with Haney clamp and the  specimen is resected in a simple fashion.  Pedicles ligated with free-tie,  then a suture of #0 Monocryl.  This as well clear of the pelvic sidewall.  Copious irrigation is carried out.  Inspection reveals the appendix had been  adherent to the superior aspect of this process; and was indurated along its  distal two-thirds; therefore appendectomy was carried out.  The mesoappendix  was taken down in 2 bites and ligated with #0Monocryl.  The base of the  appendix was crushed and then resected.  Free-tie was used to ligate the  appendiceal stump.  A pursestring of 2-0 silk was then placed; and the stump  was dunked out of site; and the pursestring was tied down.   Copious irrigation was carried out; and all returns as clear.  Packs and retractors  were removed.  The anterior peritoneum is closed in a running fashion with  #0 Monocryl suture which is also used to reapproximate the __________ muscle  in the midline.  Anterior rectus fascia is closed in a running fashion with  #0 PDS; and the skin is closed with clips.  All sponge, instrument, and  needle counts are correct.      Guy Sandifer Henderson Cloud, M.D.  Electronically Signed     JET/MEDQ  D:  02/05/2006  T:  02/05/2006  Job:  [id number]   cc:   De Blanch, M.D.  [address]  [place]  [place] [address]

## 2011-03-14 NOTE — Consult Note (Signed)
NAMECELESTINA, GIRONDA NO.:  [id number]   MEDICAL RECORD NO.:  [id number]          PATIENT TYPE:  OUT   LOCATION:  GYN                          FACILITY:  [facility]   PHYSICIAN:  De Blanch, M.D.DATE OF BIRTH:  [birth year]   DATE OF CONSULTATION:  DATE OF DISCHARGE:                                   CONSULTATION   A 45 year old white married female seen in consultation at the request of  Dr. Hulan Fess regarding newly diagnosed adenocarcinoma of the cervix.  Patient initially presented with Pap smear showing atypical glandular cells.  The colposcopy and directed biopsy showed adenocarcinoma in situ.  Subsequently, the patient underwent a cold knife conization on November 07, 2004.  She was found to have an invasive adenocarcinoma measuring 1.6 cm in  lateral extent and 4 mm in depth of invasion.  There are villoglandular  features and no evidence of lymph/vascular space invasion.  Patient  apparently had a postoperative bleed and required some additional sutures.   PAST MEDICAL HISTORY:  Medical illnesses:  None.   PAST SURGICAL HISTORY:  D&E after retained placenta.  She has had two  medically induced abortions for fetal anomalies.   CURRENT MEDICATIONS:  None.   DRUG ALLERGIES:  SULFA causes a rash.   SOCIAL HISTORY:  The patient is married.  She does not smoke.  She takes  care of her 73-year-old and 39-year-old children.   FAMILY HISTORY:  Negative for gynecologic, breast, or colon cancer.   REVIEW OF SYSTEMS:  Negative.   PHYSICAL EXAMINATION:  GENERAL:  Height 5 foot 2.  Weight 116 pounds.  In  general, the patient is a healthy white female in no acute distress.  HEENT:  Negative.  NECK:  Supple without thyromegaly.  There is no supraclavicular or inguinal  adenopathy.  ABDOMEN:  Soft and nontender.  No mass, organomegaly, ascites, or hernias  are noted.  PELVIC:  Deferred, given the fact the patient had a conization a week ago.   IMPRESSION:  Stage IA/II adenocarcinoma of the cervix.  I had a lengthy  discussion with the patient and her husband regarding management options.  They are aware that this lesion can be managed either with surgery or with  radiation therapy.  After discussing the pro's and con's of each, they are  strongly desirous of having surgery.  We then discussed different surgical  approaches, either an open surgery through a Pfannenstiel incision or  laparoscopic radical hysterectomy done robotically.  The pro's and con's of  each were also discussed.  The patient is leaning towards having robotic  surgery in hopes of having a faster recovery.  She will consider these  options, though, and let us know.  If she did opt for robotic surgery, the  surgery would be performed by Dr. Stark Jock at the [facility].   The risks of surgery, including hemorrhage, infection, injury to adjacent  viscera, thromboembolic complications, and anesthetic risks were discussed.  We also discussed the risks of bladder dysfunction following radical  hysterectomy.  Patient and her husband  will contact me.  We would plan on  letting her recover from her conization with a plan of going ahead with  surgery in approximately 5-6 weeks.      DC/MEDQ  D:  11/19/2004  T:  11/19/2004  Job:  [id number]   cc:   Guy Sandifer. Arleta Creek, M.D.  [address]  [place]  [place] [address]  Fax: [phone]   Telford Nab, R.N.  [phone]  [place], [place] [address]

## 2011-03-14 NOTE — Consult Note (Signed)
Courtney [name], Courtney [name]              ACCOUNT NO.:  [id number]   MEDICAL RECORD NO.:  [id number]          PATIENT TYPE:  OUT   LOCATION:  GYN                          FACILITY:  [facility]   PHYSICIAN:  Paola A. Duard Brady, MD    DATE OF BIRTH:  September 13, 1966   DATE OF CONSULTATION:  02/16/2007  DATE OF DISCHARGE:                                 CONSULTATION   Courtney [name] is a 45 year old who underwent a robotic-assisted radical  hysterectomy for a stage IB adenocarcinoma of the cervix.  Final  pathology was consistent with no residual tumor in her cervix and  negative lymph nodes.  At the time of her surgery, she underwent  bilateral oophoropexy.  Zero out of 31 lymph nodes were involved.  I  last saw her in August 2007 at which time her exam and Pap smear were  unremarkable.  She was seen by Dr. Henderson Cloud in December 2007 at which  time her exam and Pap were unremarkable.  She did in April 2007 undergo  diagnostic laparoscopy, exploratory laparotomy, left salpingo-  oophorectomy and appendectomy.  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  She continues to do fairly  well.  The numbness at the incisions that she had is decreasing  significantly.  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  The only thing that she does complain of is  maybe after exercising extensively and drinking a lot of fluid, or might  be a very salty diet if she eats out, she may have some swelling what  appears to mons or groins that are bothersome, but it resolves with  expectant management.  She otherwise denies any abdominal or pelvic  pain, any vaginal bleeding, any change in bowel or bladder habits, any  postcoital bleeding.   PHYSICAL EXAMINATION:  VITAL SIGNS:  Weight 122 pounds.  GENERAL:  Well-nourished, well-developed female in no acute distress.  NECK:  Supple.  There is no  lymphadenopathy, no thyromegaly.  LUNGS:  Clear to auscultation bilaterally.  CARDIOVASCULAR:  Regular rate and rhythm.  ABDOMEN:  Shows well-healed surgical incisions.  Abdomen is soft,  nontender, nondistended.  No palpable masses or splenomegaly.  Groins  are negative for adenopathy.  EXTREMITIES:  There is no edema.  PELVIC:  External genitalia is within normal limits.  The vagina is well  epithelized. The vaginal cuff is somewhat atrophic.  However, there are  no gross visible lesions.  ThinPrep Pap was submitted without  difficulty.  Bimanual examination reveals no masses or nodularity.  RECTAL:  Confirms.   ASSESSMENT:  A 45 year old with stage IB adenocarcinoma of the cervix  who has no evidence of recurrent disease.   PLAN:  She will to follow up with Dr. Henderson Cloud in 4 months and return to  see [place] in 8 months.  I have encouraged her to keep a small calendar of  when she has her symptomatology of this swelling and see if we can  determine the  etiology for this.  She does not have overwhelming  lymphedema, but we want to ensure that she does not develop any edema  that could subsequently cause her to have chronic venous stasis.      Paola A. Duard Brady, MD  Electronically Signed     PAG/MEDQ  D:  02/16/2007  T:  02/16/2007  Job:  [id number]   cc:   Guy Sandifer. Henderson Cloud, M.D.  Fax: [phone]   Telford Nab, R.N.  [address]  [place], [place] [address]

## 2011-03-14 NOTE — H&P (Signed)
   NAME:  Courtney [name], Courtney [name]                        ACCOUNT NO.:  [id number]   MEDICAL RECORD NO.:  [id number]                   PATIENT TYPE:  AMB   LOCATION:  SDC                                  FACILITY:  [facility]   PHYSICIAN:  Tracie Harrier, M.D.              DATE OF BIRTH:  20-Oct-1966   DATE OF ADMISSION:  09/06/2002  DATE OF DISCHARGE:                                HISTORY & PHYSICAL   HISTORY OF PRESENT ILLNESS:  The patient is a 45 year old female gravida 4  para 2 status post recent termination for a chromosomally abnormal fetus at  [facility].  This was done by vaginal delivery.  She is admitted at this  time to undergo D&C for retained products of conception.  The patient was  seen in the emergency room recently where she was noted to have cramping and  bleeding.  An ultrasound at that time showed retained products of  conception.  She continues to have cramping, bleeding, with passing of blood  clots.   Her pregnancy was ended by termination of pregnancy due to a chromosomally  abnormal fetus.   OBSTETRICAL LABORATORY DATA:  Maternal blood type O positive.  Rubella  immune.   GENETIC HISTORY:  None.   SURGICAL HISTORY:  None.   OBSTETRICAL HISTORY:  1. Normal spontaneous vaginal delivery x2 at term.  2. TAB at 20 weeks in 2003 for a fetus with trisomy 21.  3. TAB in 2003 - recent - for a chromosomally abnormal fetus.   CURRENT MEDICATIONS:  Keflex.   ALLERGIES:  None known.   PHYSICAL EXAMINATION:  VITAL SIGNS:  Stable, blood pressure 120/80.  GENERAL:  She is a well-developed, well-nourished female in no acute  distress.  HEENT:  Within normal limits.  NECK:  Supple without adenopathy or thyromegaly.  HEART:  Regular rate and rhythm without murmur, gallop, or rub.  LUNGS:  Clear to auscultation.  BREASTS:  Exam has been done recently but is deferred upon admission.  ABDOMEN:  Soft and benign  EXTREMITIES AND NEUROLOGIC:  Grossly normal.  PELVIC:  Normal  external female genitalia.  Vagina and cervix clear.  The  uterus is about six weeks in size and nontender.  The adnexa is clear.  There is a small amount of vaginal bleeding noted.   ASSESSMENT:  Retained products of conception.   PLAN:  D&E.   DISCUSSION:  The risks and benefits as well as need for this procedure were  discussed with the patient.  Questions were answered.                                               Tracie Harrier, M.D.    REG/MEDQ  D:  09/06/2002  T:  09/06/2002  Job:  [id number]

## 2011-03-14 NOTE — Op Note (Signed)
NAMEDENISE, Courtney [name]              ACCOUNT NO.:  [id number]   MEDICAL RECORD NO.:  [id number]          PATIENT TYPE:  AMB   LOCATION:  SDC                           FACILITY:  [facility]   PHYSICIAN:  Guy Sandifer. Henderson Cloud, M.D. DATE OF BIRTH:  [birth year]   DATE OF PROCEDURE:  02/05/2006  DATE OF DISCHARGE:                                 OPERATIVE REPORT   PREOPERATIVE DIAGNOSIS:  Hematuria postoperative.   POSTOPERATIVE DIAGNOSIS:  Hematuria postoperative.   PROCEDURE:  Cystoscopy.   SURGEON:  Guy Sandifer. Henderson Cloud, M.D.   ANESTHESIA:  General endotracheal intubation.   INDICATIONS AND CONSENT:  This patient is a 45 year old married white female  who had just undergone exploratory laparotomy with left salpingo-  oophorectomy and appendectomy.  A Foley catheter was placed prior to  beginning the laparotomy.  The urine output had remained clear throughout  the case.  At the completion of the surgery, all counts were correct.  The  patient was stable.  While dictating the operative note, I was notified that  when the patient's legs had been lowered out of the stirrups, they noted  blood in the urine.   PROCEDURE:  Upon arriving in the operating room, the urine had moderate red  staining.  Therefore, the decision to proceed cystoscopy was made.  The  Foley catheter was removed.  Then, using a 30 degrees cystoscope, cystoscopy  is carried out.  The urethra appears intact.  There were a few petechial  hemorrhages on the wall of the bladder, probably secondary to the retractor.  There was no free bleeding under reduced water pressure.  There were no  foreign bodies and no evidence of perforation of the bladder.  A good puff  of urine was noted from the ureteral orifices bilaterally.  The procedure  was then terminated.  The cystoscope was removed, the Foley catheter was  replaced.  All counts were correct.  The patient was then awakened and taken  to recovery room in stable condition.      Guy Sandifer Henderson Cloud, M.D.  Electronically Signed     JET/MEDQ  D:  02/05/2006  T:  02/05/2006  Job:  [id number]

## 2011-03-14 NOTE — Op Note (Signed)
Courtney [name], Courtney [name]              ACCOUNT NO.:  [id number]   MEDICAL RECORD NO.:  [id number]          PATIENT TYPE:  AMB   LOCATION:  SDC                           FACILITY:  [facility]   PHYSICIAN:  Guy Sandifer. Tomblin II, M.D.DATE OF BIRTH:  [birth year]   DATE OF PROCEDURE:  11/06/2004  DATE OF DISCHARGE:                                 OPERATIVE REPORT   PREOPERATIVE DIAGNOSIS:  Adenocarcinoma in situ of cervix.   POSTOPERATIVE DIAGNOSIS:  Adenocarcinoma in situ of cervix.   PROCEDURES:  Cold knife conization of cervix.   SURGEON:  Guy Sandifer. Henderson Cloud, M.D.   ANESTHESIA:  MAC.   SPECIMEN:  Cervical cone.   ESTIMATED BLOOD LOSS:  100 mL.   INDICATIONS AND CONSENT:  This patient is a 45 year old, married, white  female, G4, P2, with adenocarcinoma in situ on colposcopically-directed  biopsies of the cervix.  Cold-knife conization has been discussed with the  patient and her husband.  Potential risks and complications have been  reviewed preoperatively including, but not limited to, infection, organ  damage, bleeding requiring transfusion of blood products with possible  transfusion reaction, HIV and hepatitis acquisition, DVT, PE, pneumonia,  hysterectomy, and recurrent postoperative bleeding.  The patient states that  she does not want any further childbearing.  The possible infertility  factors of conization of the cervix have also been reviewed.   DESCRIPTION OF PROCEDURE:  The patient was taken to the operating room where  she was identified, placed in dorsal supine position, and intravenous  anesthetic is administered.  She was then placed in the dorsal lithotomy  position where she was prepped with Hibiclens, bladder straight-  catheterized, and she is draped in a sterile fashion.  Bivalve speculum was  placed in the vagina.  The transformation zone can be noted to be widely  displaced on the ectocervix.  Lugol solution was applied.  Anchoring sutures  of 0 Vicryl are placed at  the 3 o'clock and 9 o'clock position.  Then a  scalpel is used to completely circumscribe all of the transformation zone,  including any areas highlighted by the Lugol solution.  This was carried out  and the cone shaped with a knife and scissors.  Prior to beginning the cone,  the area had been injected with 1% Xylocaine with 1:200,000 epinephrine.  After the cone had been removed, the base is cauterized with unipolar  cautery.  Gelfoam soaked in Monsel solution is then placed on the base, and  0 Vicryl is used in an interrupted fashion to obtain hemostasis.  A final  piece of Gelfoam is placed over  the cervix, and the two anchoring sutures are tied in the midline to anchor  it in place.  The cervix is patent as tested by uterine sounds.  Good  hemostasis is obtained.  All counts are correct.  The patient is awakened  and taken to the recovery room in stable condition.      JET/MEDQ  D:  11/06/2004  T:  11/06/2004  Job:  [id number]

## 2011-03-14 NOTE — Consult Note (Signed)
NAMEBRYLA, BUREK              ACCOUNT NO.:  [id number]   MEDICAL RECORD NO.:  [id number]          PATIENT TYPE:  OUT   LOCATION:  GYN                          FACILITY:  [facility]   PHYSICIAN:  Paola A. Duard Brady, MD    DATE OF BIRTH:  07/19/1966   DATE OF CONSULTATION:  05/27/2006  DATE OF DISCHARGE:                                   CONSULTATION   Courtney [name] is a 45 year old who underwent a robotic assisted radical  hysterectomy for stage IB cervical carcinoma February 2006. Final pathology  was consistent with no residual tumor in her cervix, negative lymph nodes.  She did well postoperatively with no sequela. I last saw her December 2006  at which time her exam and Pap smear were unremarkable. She was seen by Dr.  Henderson Cloud in March 2007 at which time her Pap smear and exam were negative.  She presented to Dr. Henderson Cloud acutely with acute onset of pain for six days.  Was seen in [place] with CT scan and ultrasound, and there was concern of  an ovarian torsion. Her symptoms resolved, and she was discharged home. She  then presented to Dr. Henderson Cloud on April 10 with complaints of torsion type  symptoms. On February 05, 2006 she underwent diagnostic laparoscopy,  exploratory laparotomy, left salpingo-oophorectomy, and appendectomy. 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 She is  doing well postoperatively. She started Pilates six weeks ago which has  helped her feel better. She continues to have some numbness at the incision.  Also she has some right thigh __________ which she had immediately after her  radical hysterectomy that resolved, and now she has it is again after this  most recent surgery. It is not interfering with her quality of life, and she  states that she barely notices it.   MEDICATIONS:  There have been no changes.   FAMILY HISTORY:  No new changes.   PHYSICAL EXAMINATION:  VITAL SIGNS:  Weight 118 pounds.  GENERAL:  Well-nourished,  well-developed female in no acute distress.  NECK:  Supple. There is no lymphadenopathy, no thyromegaly.  LUNGS:  Clear to auscultation bilaterally.  CARDIOVASCULAR:  Regular rate and rhythm.  ABDOMEN:  Shows well-healed laparoscopy incisions as well as a well-healed  transverse skin incision. Abdomen is soft and nontender. Groin negative for  adenopathy.  EXTREMITIES:  No edema.  PELVIC:  External genitalia is within normal limits. The vagina is well  epithelialized. The vaginal cuff is visualized. There are no visible  lesions. ThinPrep Pap was submitted without difficulty. Bimanual examination  reveals no masses or nodularity. Rectal confirms.   ASSESSMENT:  A 45 year old who has IB adenocarcinoma of the cervix who  experienced a left ovarian torsion with negative pathology. She is concerned  that this could happen on the right side. She is also concerned that this  could be a sequela of her radical hysterectomy. I discussed with her that  ovarian torsion in the setting of an oophoropexy which is most likely what  she had done at the time of her radical hysterectomy would  be very unlikely,  and I believe the likelihood of this happening on the contralateral side  would be exceedingly rare. She was very reassured regarding this.   PLAN:  Will follow up results of her Pap smear from today. She will see Dr.  Henderson Cloud in four months and return to see [place] in eight. Her questions were  elicited and answered to her satisfaction.      Paola A. Duard Brady, MD  Electronically Signed     PAG/MEDQ  D:  05/27/2006  T:  05/27/2006  Job:  [id number]

## 2011-03-14 NOTE — H&P (Signed)
Courtney [name], Courtney [name]              ACCOUNT NO.:  [id number]   MEDICAL RECORD NO.:  [id number]           PATIENT TYPE:   LOCATION:                                 FACILITY:   PHYSICIAN:  Guy Sandifer. Arleta Creek, M.D.   DATE OF BIRTH:   DATE OF ADMISSION:  DATE OF DISCHARGE:                                HISTORY & PHYSICAL   CHIEF COMPLAINT:  Abnormal Pap smear.   HISTORY OF PRESENT ILLNESS:  This patient is a 45 year old married white  female, G4, P2, who was found to have atypical squamous cells, cannot  exclude high-grade disease, in November of 2005.  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  Subsequent evaluation on 10/07/2004 revealed a Pap smear  consistent with a glandular cell abnormality and colposcopically-directed  biopsies consistent with adenocarcinoma in situ in two locations and a rare  atypical endocervical fragment on EEC.  The patient is being admitted for  cold-knife conization of the cervix.  The potential risks and complications  have been discussed preoperatively.   PAST MEDICAL HISTORY:  HSV with rare outbreak.   PAST SURGICAL HISTORY:  Negative.   OBSTETRICAL HISTORY:  Vaginal delivery x3 and TAB x1 for trisomy 21.   FAMILY HISTORY:  Heart disease in maternal grandfather.  Chronic  hypertension in maternal grandfather.  Skin cancer in maternal grandfather.  Varicosities in mother, maternal grandmother, paternal grandmother.  Alcohol  addiction in paternal uncles and maternal uncle.   MEDICATIONS:  Birth control pill.   ALLERGIES:  SULFA.   SOCIAL HISTORY:  Denies tobacco, alcohol, or drug abuse.   REVIEW OF SYSTEMS:  NEUROLOGIC:  Denies headaches.  CARDIAC:  Denies chest  pain.  PULMONARY:  Denies shortness of breath.  GI:  Denies recent changes  in bowel habits.   PHYSICAL EXAMINATION:  VITAL SIGNS:  Height 5 feet 1.5 inches, weight 121  pounds.  Blood pressure 130/80.  NECK:  Without thyromegaly.  LUNGS:  Clear to auscultation.  HEART:  Regular rate and  rhythm.  BACK:  Without CVA tenderness.  BREASTS:  Not examined.  ABDOMEN:  Soft, nontender, without masses.  PELVIC:  Vulva, vagina, cervix without lesion, except for widely displaced  ectropion on the cervix.  Uterus anteverted, mobile, nontender.  Adnexa  nontender without masses.  EXTREMITIES:  Neurological exam grossly within normal limits.   ASSESSMENT:  Adenocarcinoma in situ.   PLAN:  Cold-knife conization of the cervix.     Jame   JET/MEDQ  D:  10/30/2004  T:  10/30/2004  Job:  [id number]

## 2011-03-14 NOTE — Discharge Summary (Signed)
[name], [name]              ACCOUNT NO.:  [id number]   MEDICAL RECORD NO.:  [id number]          PATIENT TYPE:  OBV   LOCATION:  5148                         FACILITY:  [facility]   PHYSICIAN:  Lennie Muckle, MD      DATE OF BIRTH:  07-07-1966   DATE OF ADMISSION:  07/27/2008  DATE OF DISCHARGE:  07/30/2008                               DISCHARGE SUMMARY   PROCEDURES:  There were none.   CONSULTATION:  There were none.   REASON FOR ADMISSION:  [name] is a 45 year old white female who had a  history of cervical adenocarcinoma and has had a partial hysterectomy  laparoscopically as well as essentially a left salpingo-oophorectomy and  appendectomy.  She presented to the emergency department with left lower  quadrant abdominal pain which began the night prior to admission.  She  states that this is an intermittent abdominal pain.  She did not have  any associated nausea or vomiting the night prior to presenting to the  emergency department.  However, when the patient woke up in the morning,  she had worsening abdominal pain and at this time had 1 episode of  emesis.  At this time, she presented to the emergency department where  she was found to have a partial small bowel obstruction on CT scan.  At  this time, the patient was admitted for observation.  She had IV fluids  started and was put on bowel rest and made n.p.o..  Please see admitting  history and physical for further details.   ADMITTING DIAGNOSES:  1. Partial small bowel obstruction.  2. History of cervical adenocarcinoma.  3. History of kidney stones.   HOSPITAL COURSE:  On hospital day #1, the patient states that she was  feeling better, however, was still not passing gas.  X-rays were taken  which showed a persistent partial small bowel obstruction with air-fluid  levels.  Therefore, at this time, she was continued n.p.o. and on bowel  rest.  By hospital day #2, the patient was passing flatus, however, has  not  had a bowel movement.  On exam, her abdomen was soft, nontender, and  nondistended.  At this time, she was started on clear liquids.  By  hospital day #3, the patient was continuing to have flatus and no more  pain.  At this time, her diet was advanced and after the patient  tolerated a regular diet at lunch, the patient was discharged home.   DISCHARGE DIAGNOSES:  1. Partial small bowel obstruction, resolved.  2. History of cervical adenocarcinoma.  3. History of kidney stones.   DISCHARGE MEDICATIONS:  She may continue her calcium supplements 500 mg  b.i.d. as well as her MVI daily.  She was also told that she may take a  stool softener of her choice as needed for constipation.   DISCHARGE INSTRUCTIONS:  The patient was informed that she may return to  work when she felt ready; otherwise, she did not have any dietary  restrictions or activity restrictions.  She was informed as stated  earlier that  she may take a  stool softener of choice as needed for constipation,  otherwise she did not need to return to see any of these surgeons.  However, she is to follow up with her primary care physician or Dr.  Henderson Cloud, who is her OB/GYN as needed.      Letha Cape, PA      Lennie Muckle, MD  Electronically Signed    KEO/MEDQ  D:  08/30/2008  T:  08/31/2008  Job:  [id number]   cc:   Guy Sandifer. Henderson Cloud, M.D.

## 2011-03-14 NOTE — Op Note (Signed)
NAMEJESSECA, MARSCH              ACCOUNT NO.:  [id number]   MEDICAL RECORD NO.:  [id number]          PATIENT TYPE:  INP   LOCATION:  9320                          FACILITY:  [facility]   PHYSICIAN:  Duke Salvia. Marcelle Overlie, M.D.DATE OF BIRTH:  [birth year]   DATE OF PROCEDURE:  11/16/2004  DATE OF DISCHARGE:                                 OPERATIVE REPORT   PREOPERATIVE DIAGNOSIS:  Postoperative bleeding after a CKC.   POSTOPERATIVE DIAGNOSIS:  Postoperative bleeding after a CKC.   PROCEDURE:  Resuturing of cone bed.   SURGEON:  Duke Salvia. Marcelle Overlie, M.D.   ANESTHESIA:  Sedation.   ESTIMATED BLOOD LOSS:  200 mL including old blood.   PROCEDURE AND FINDINGS:  The patient taken to the operating room.  After an  adequate level of sedation was obtained with the patient's legs in stirrups,  the perineum and vagina were prepped and draped with Betadine.  __about  200cc of________clot was removed during prepping.  She was prepped and  draped.  The weighted speculum was positioned.  The old chromic suture, some  of which had dissolved or partially dissolved were removed, exposing the  cone bed.  There was some active bleeding, primarily along the posterior  cuff.  A modified Sturmdorf suture was placed at the posterior cuff x 2 and  on each lateral side.  This was observed and over 5-7 minutes, no further  bleeding was noted.  Gelfoam was placed into the cone bed.  Further  observation was carried, and no further bleeding was noted.  She tolerated  this well, will be observed overnight.     Rich   RMH/MEDQ  D:  11/16/2004  T:  11/17/2004  Job:  [id number]

## 2011-03-20 ENCOUNTER — Ambulatory Visit
Admission: RE | Admit: 2011-03-20 | Discharge: 2011-03-20 | Disposition: A | Discharge status: Home / Self Care | Payer: Private Health Insurance - Indemnity | Source: Ambulatory Visit | Attending: Obstetrics and Gynecology | Admitting: Obstetrics and Gynecology

## 2011-03-20 DIAGNOSIS — Z1231 Encounter for screening mammogram for malignant neoplasm of breast: Secondary | ICD-10-CM

## 2011-07-29 LAB — HEPATIC FUNCTION PANEL
ALT: 17
AST: 28
Albumin: 4.3
Alkaline Phosphatase: 51
Bilirubin, Direct: 0.3
Indirect Bilirubin: 1.1 — ABNORMAL HIGH
Total Bilirubin: 1.4 — ABNORMAL HIGH
Total Protein: 7.5

## 2011-07-29 LAB — POCT URINALYSIS DIP (DEVICE)
Bilirubin Urine: NEGATIVE
Ketones, ur: 15 — AB
Operator id: 247071
Protein, ur: 100 — AB
Specific Gravity, Urine: 1.015

## 2011-07-29 LAB — CBC
HCT: 35.1 — ABNORMAL LOW
Hemoglobin: 11.9 — ABNORMAL LOW
Hemoglobin: 14.3
MCHC: 34
MCHC: 34
MCV: 96
Platelets: 365
RBC: 3.66 — ABNORMAL LOW
RDW: 12.8
RDW: 13
WBC: 8.3

## 2011-07-29 LAB — URINE CULTURE

## 2011-07-29 LAB — BASIC METABOLIC PANEL
CO2: 25
Calcium: 8.5
Creatinine, Ser: 0.73
GFR calc Af Amer: >60
Glucose, Bld: 103 — ABNORMAL HIGH
Sodium: 135

## 2011-07-29 LAB — DIFFERENTIAL
Eosinophils Absolute: 0
Eosinophils Relative: 0
Lymphs Abs: 1.5
Monocytes Relative: 4

## 2012-02-23 ENCOUNTER — Other Ambulatory Visit: Payer: Self-pay | Admitting: Obstetrics and Gynecology

## 2012-02-23 DIAGNOSIS — Z1231 Encounter for screening mammogram for malignant neoplasm of breast: Secondary | ICD-10-CM

## 2012-03-24 ENCOUNTER — Ambulatory Visit
Admission: RE | Admit: 2012-03-24 | Discharge: 2012-03-24 | Disposition: A | Discharge status: Home / Self Care | Payer: Private Health Insurance - Indemnity | Source: Ambulatory Visit | Attending: Obstetrics and Gynecology | Admitting: Obstetrics and Gynecology

## 2012-03-24 DIAGNOSIS — Z1231 Encounter for screening mammogram for malignant neoplasm of breast: Secondary | ICD-10-CM

## 2012-06-22 ENCOUNTER — Other Ambulatory Visit: Payer: Self-pay

## 2013-02-16 ENCOUNTER — Other Ambulatory Visit: Payer: Self-pay

## 2013-02-16 DIAGNOSIS — Z1231 Encounter for screening mammogram for malignant neoplasm of breast: Secondary | ICD-10-CM

## 2013-03-25 ENCOUNTER — Ambulatory Visit: Payer: Private Health Insurance - Indemnity

## 2013-03-25 ENCOUNTER — Ambulatory Visit
Admission: RE | Admit: 2013-03-25 | Discharge: 2013-03-25 | Disposition: A | Discharge status: Home / Self Care | Payer: Private Health Insurance - Indemnity | Source: Ambulatory Visit

## 2013-03-25 DIAGNOSIS — Z1231 Encounter for screening mammogram for malignant neoplasm of breast: Secondary | ICD-10-CM

## 2014-02-23 ENCOUNTER — Other Ambulatory Visit: Payer: Self-pay

## 2014-02-23 DIAGNOSIS — Z1231 Encounter for screening mammogram for malignant neoplasm of breast: Secondary | ICD-10-CM

## 2014-03-21 ENCOUNTER — Encounter (INDEPENDENT_AMBULATORY_CARE_PROVIDER_SITE_OTHER): Payer: Self-pay

## 2014-03-21 ENCOUNTER — Ambulatory Visit
Admission: RE | Admit: 2014-03-21 | Discharge: 2014-03-21 | Disposition: A | Discharge status: Home / Self Care | Payer: Managed Care, Other (non HMO) | Source: Ambulatory Visit

## 2014-03-21 DIAGNOSIS — Z1231 Encounter for screening mammogram for malignant neoplasm of breast: Secondary | ICD-10-CM

## 2014-06-10 ENCOUNTER — Encounter: Payer: Self-pay | Admitting: *Deleted

## 2014-06-23 ENCOUNTER — Other Ambulatory Visit: Payer: Self-pay | Admitting: Obstetrics and Gynecology

## 2014-06-28 LAB — CYTOLOGY - PAP

## 2014-11-06 ENCOUNTER — Other Ambulatory Visit: Payer: Self-pay | Admitting: *Deleted

## 2014-11-06 DIAGNOSIS — I83813 Varicose veins of bilateral lower extremities with pain: Secondary | ICD-10-CM

## 2014-11-29 ENCOUNTER — Encounter: Payer: Self-pay | Admitting: Vascular Surgery

## 2014-12-01 ENCOUNTER — Ambulatory Visit (INDEPENDENT_AMBULATORY_CARE_PROVIDER_SITE_OTHER): Payer: Managed Care, Other (non HMO) | Admitting: Vascular Surgery

## 2014-12-01 ENCOUNTER — Encounter: Payer: Self-pay | Admitting: Vascular Surgery

## 2014-12-01 ENCOUNTER — Ambulatory Visit (HOSPITAL_COMMUNITY)
Admission: RE | Admit: 2014-12-01 | Discharge: 2014-12-01 | Disposition: A | Discharge status: Home / Self Care | Payer: Managed Care, Other (non HMO) | Source: Ambulatory Visit | Attending: Vascular Surgery | Admitting: Vascular Surgery

## 2014-12-01 VITALS — BP 120/82 | HR 78 | Ht 62.0 in | Wt 121.3 lb

## 2014-12-01 DIAGNOSIS — I8393 Asymptomatic varicose veins of bilateral lower extremities: Secondary | ICD-10-CM

## 2014-12-01 DIAGNOSIS — I83813 Varicose veins of bilateral lower extremities with pain: Secondary | ICD-10-CM | POA: Diagnosis present

## 2014-12-01 DIAGNOSIS — I872 Venous insufficiency (chronic) (peripheral): Secondary | ICD-10-CM

## 2014-12-01 NOTE — Progress Notes (Signed)
Reason for referral: Prominent varicose veins  History of Present Illness  Courtney [name] is a 49 y.o. ([birth year]) female who presents with chief complaint: increase prominence in varicose veins.  Patients notes increased prominence of calf varicosities in both lateral calves.  She notes mild edema occasionally but not significant pain or bursting sensation.  Also with increased standing her veins become more prominent.  The patient has had never history of DVT, + history of pregnancy, + history of varicose vein, no history of venous stasis ulcers, no history of  Lymphedema and no history of skin changes in lower legs.  There is known family history of venous disorders.  The patient has never used compression stockings in the past.  Past Medical History  Diagnosis Date  . Cervical ca   . PAC (premature atrial contraction)     Past Surgical History  Procedure Laterality Date  . Spider vein injection    . Appendectomy    . Abdominal hysterectomy      History   Social History  . Marital Status: Married    Spouse Name: N/A    Number of Children: N/A  . Years of Education: N/A   Occupational History  . Not on file.   Social History Main Topics  . Smoking status: Never Smoker   . Smokeless tobacco: Not on file  . Alcohol Use: 1.8 - 3.0 oz/week    3-5 Glasses of wine per week  . Drug Use: No  . Sexual Activity: Not on file   Other Topics Concern  . Not on file   Social History Narrative    Family History  Problem Relation Age of Onset  . Cancer Mother   . Varicose Veins Mother   . Cancer Father     Current Outpatient Prescriptions on File Prior to Visit  Medication Sig Dispense Refill  . Cholecalciferol (VITAMIN D) 400 UNITS capsule Take 400 Units by mouth daily.    Marland Kitchen loratadine (CLARITIN) 10 MG tablet Take 10 mg by mouth daily.    . calcium gluconate 500 MG tablet Take 1 tablet by mouth 3 (three) times daily.    Marland Kitchen docusate sodium (COLACE) 100 MG capsule  Take 100 mg by mouth 4 (four) times daily as needed for mild constipation.    . Multiple Vitamin (MULTIVITAMIN) tablet Take 1 tablet by mouth daily.    . Omega-3 Fatty Acids (FISH OIL) 1200 MG CAPS Take by mouth 3 (three) times daily.     No current facility-administered medications on file prior to visit.    Allergies  Allergen Reactions  . Sulfonamide Derivatives     REACTION: rash    REVIEW OF SYSTEMS:  (Positives checked otherwise negative)  CARDIOVASCULAR:  []  chest pain, []  chest pressure, []  palpitations, []  shortness of breath when laying flat, []  shortness of breath with exertion,  []  pain in feet when walking, []  pain in feet when laying flat, []  history of blood clot in veins (DVT), []  history of phlebitis, []  swelling in legs, [x]  varicose veins  PULMONARY:  []  productive cough, []  asthma, []  wheezing  NEUROLOGIC:  []  weakness in arms or legs, []  numbness in arms or legs, []  difficulty speaking or slurred speech, []  temporary loss of vision in one eye, []  dizziness  HEMATOLOGIC:  []  bleeding problems, []  problems with blood clotting too easily  MUSCULOSKEL:  []  joint pain, []  joint swelling  GASTROINTEST:  []  vomiting blood, []  blood in stool  GENITOURINARY:  []  burning with urination, []  blood in urine  PSYCHIATRIC:  []  history of major depression  INTEGUMENTARY:  []  rashes, []  ulcers  CONSTITUTIONAL:  []  fever, []  chills   Physical Examination Filed Vitals:   12/01/14 1105  BP: 120/82  Pulse: 78  Height: 5\' 2"  (1.575 m)  Weight: 121 lb 4.8 oz (55.021 kg)  SpO2: 100%   Body mass index is 22.18 kg/(m^2).  General: A&O x 3, WDWN  Head: [place]/AT  Ear/Nose/Throat: Hearing grossly intact, nares w/o erythema or drainage, oropharynx w/o Erythema/Exudate  Eyes: PERRLA, EOMI  Neck: Supple, no nuchal rigidity, no palpable LAD  Pulmonary: Sym exp, good air movt, CTAB, no rales, rhonchi, & wheezing  Cardiac: RRR, Nl S1, S2, no Murmurs, rubs or  gallops  Vascular: Vessel Right Left  Radial Palpable Palpable  Brachial Palpable Palpable  Carotid Palpable, without bruit Palpable, without bruit  Aorta Not palpable N/A  Femoral Palpable Palpable  Popliteal Not palpable Not palpable  PT Palpable Palpable  DP Palpable Palpable   Gastrointestinal: soft, NTND, -G/R, - HSM, - masses, - CVAT B  Musculoskeletal: M/S 5/5 throughout , Extremities without ischemic changes   Neurologic: Pain and light touch intact in extremities , Motor exam as listed above  Psychiatric: Judgment intact, Mood & affect appropriate for pt's clinical situation  Dermatologic: See M/S exam for extremity exam, no rashes otherwise noted  Lymph : No Cervical, Axillary, or Inguinal lymphadenopathy    Non-Invasive Vascular Imaging  BLE Venous Insufficiency Duplex (Date: 12/01/2014):   RLE: no DVT, partial SVT, + GSV reflux, + deep venous reflux: SFV  LLE: no DVT and SVT, + GSV reflux, + deep venous reflux: CFV, SFV   Medical Decision Making  Courtney [name] is a 49 y.o. female who presents with: BLE chronic venous insufficiency (C2).   Based on the patient's history and examination, I recommend: compressive therapy.  I discussed with the patient the use of her 20-30 mm thigh high compression stockings and need for 3 month trial of such.  Pt would like referral to Vein Clinic in 3 months for evaluation for EVLA.  I will defer further conversation in regards to eligibility for EVLA to the Vein Clinic at that time.  Thank you for allowing [place] to participate in this patient's care.  Adele Barthel, MD [facility] Office: [phone] Pager: [phone]  12/01/2014, 11:52 AM

## 2014-12-07 ENCOUNTER — Encounter: Payer: Self-pay | Admitting: *Deleted

## 2014-12-08 ENCOUNTER — Ambulatory Visit (INDEPENDENT_AMBULATORY_CARE_PROVIDER_SITE_OTHER): Payer: Managed Care, Other (non HMO) | Admitting: *Deleted

## 2014-12-08 DIAGNOSIS — I8393 Asymptomatic varicose veins of bilateral lower extremities: Secondary | ICD-10-CM

## 2014-12-08 NOTE — Progress Notes (Signed)
X=.3% Sotradecol administered with a 27g butterfly.  Patient received a total of 6cc.  Treated her reticulars that concern her. Easy access. Tol well. Anticipate good results. Follow prn.  Photos: No.  Compression stockings applied: Yes.  and aces.

## 2014-12-12 ENCOUNTER — Encounter: Payer: Self-pay | Admitting: Vascular Surgery

## 2015-01-01 ENCOUNTER — Other Ambulatory Visit: Payer: Self-pay | Admitting: Obstetrics and Gynecology

## 2015-01-02 LAB — CYTOLOGY - PAP

## 2015-03-02 ENCOUNTER — Other Ambulatory Visit: Payer: Self-pay

## 2015-03-02 DIAGNOSIS — Z1231 Encounter for screening mammogram for malignant neoplasm of breast: Secondary | ICD-10-CM

## 2015-03-19 ENCOUNTER — Ambulatory Visit
Admission: RE | Admit: 2015-03-19 | Discharge: 2015-03-19 | Disposition: A | Discharge status: Home / Self Care | Payer: Managed Care, Other (non HMO) | Source: Ambulatory Visit

## 2015-03-19 DIAGNOSIS — Z1231 Encounter for screening mammogram for malignant neoplasm of breast: Secondary | ICD-10-CM

## 2015-09-03 ENCOUNTER — Encounter: Payer: Self-pay | Admitting: Gastroenterology

## 2016-02-21 ENCOUNTER — Other Ambulatory Visit: Payer: Self-pay

## 2016-02-21 DIAGNOSIS — Z1231 Encounter for screening mammogram for malignant neoplasm of breast: Secondary | ICD-10-CM

## 2016-03-21 ENCOUNTER — Ambulatory Visit
Admission: RE | Admit: 2016-03-21 | Discharge: 2016-03-21 | Disposition: A | Discharge status: Home / Self Care | Payer: 59 | Source: Ambulatory Visit

## 2016-03-21 DIAGNOSIS — Z1231 Encounter for screening mammogram for malignant neoplasm of breast: Secondary | ICD-10-CM

## 2017-03-20 ENCOUNTER — Other Ambulatory Visit: Payer: Self-pay | Admitting: Obstetrics and Gynecology

## 2017-03-20 DIAGNOSIS — Z1231 Encounter for screening mammogram for malignant neoplasm of breast: Secondary | ICD-10-CM

## 2017-04-08 ENCOUNTER — Ambulatory Visit: Payer: Self-pay

## 2017-04-20 ENCOUNTER — Ambulatory Visit
Admission: RE | Admit: 2017-04-20 | Discharge: 2017-04-20 | Disposition: A | Discharge status: Home / Self Care | Payer: 59 | Source: Ambulatory Visit | Attending: Obstetrics and Gynecology | Admitting: Obstetrics and Gynecology

## 2017-04-20 DIAGNOSIS — Z1231 Encounter for screening mammogram for malignant neoplasm of breast: Secondary | ICD-10-CM

## 2017-06-01 ENCOUNTER — Ambulatory Visit (INDEPENDENT_AMBULATORY_CARE_PROVIDER_SITE_OTHER): Payer: 59

## 2017-06-01 ENCOUNTER — Encounter: Payer: Self-pay | Admitting: Podiatry

## 2017-06-01 ENCOUNTER — Ambulatory Visit (INDEPENDENT_AMBULATORY_CARE_PROVIDER_SITE_OTHER): Payer: 59 | Admitting: Podiatry

## 2017-06-01 VITALS — BP 114/70 | HR 67 | Resp 18

## 2017-06-01 DIAGNOSIS — M722 Plantar fascial fibromatosis: Secondary | ICD-10-CM | POA: Diagnosis not present

## 2017-06-01 DIAGNOSIS — M21622 Bunionette of left foot: Secondary | ICD-10-CM | POA: Diagnosis not present

## 2017-06-01 MED ORDER — MELOXICAM 15 MG PO TABS
15.0000 mg | ORAL_TABLET | Freq: Every day | ORAL | 2 refills | Status: AC
Start: 2017-06-01 — End: 2018-06-01

## 2017-06-01 NOTE — Patient Instructions (Signed)

## 2017-06-01 NOTE — Progress Notes (Signed)
   Subjective:    Patient ID: Courtney [name], female    DOB: 09-13-1966, 51 y.o.   MRN: [id number]  HPI  [name] Presents the office today for concerns of left foot pain she points on Taylor's bunion left foot. She states that in early May shoe holder ankle which started pain to the foot. She states that the ankle pain has resolved however she is beginning some intermittent pain to this area. She says it was felt better for some time but is starting to become more painful mostly was shoes or pressure. She states that she is obese last week as well as started to flare the area. She denies any redness or warmth G has noticed. No numbness or tingling. She does also states that she has a history of plantar fasciitis and since his revision walking differently she's tried a little bit of pain to the bottom of her heel although minimal. other complaints.   Review of Systems  All other systems reviewed and are negative.      Objective:   Physical Exam General: AAO x3, NAD  Dermatological: Skin is warm, dry and supple bilateral. Nails x 10 are well manicured; remaining integument appears unremarkable at this time. There are no open sores, no preulcerative lesions, no rash or signs of infection present.  Vascular: Dorsalis Pedis artery and Posterior Tibial artery pedal pulses are 2/4 bilateral with immedate capillary fill time. There is no pain with calf compression, swelling, warmth, erythema.   Neruologic: Grossly intact via light touch bilateral.Protective threshold with Semmes Wienstein monofilament intact to all pedal sites bilateral.   Musculoskeletal:  Mild to moderate Taylor's bunion is present on the left foot and there is localized erythema to the lateral aspect of the fifth metatarsal head on the tailor's bunion irritation. Is no fluctuance crepitus there's no increase in warmth. This demonstrates any synovitis. There is no clinical signs of infection present otherwise. No significant  edema. There is minimal tenderness palpation area today. There is no other area pinpoint bony tenderness pain vibratory sensation. No pain to the ankle joint, ankle ligaments were area of pinpoint tenderness. there is no pain on the course or insertion the plantar fascia. There is no pain lateral compression of the calcaneus. Plantar fascia. The intact.  Muscular strength 5/5 in all groups tested bilateral.  Gait: Unassisted, Nonantalgic.     Assessment & Plan:  51 year old female left foot symptomatic Taylor's bunion, capsulitis, plantar fasciitis  -Treatment options discussed including all alternatives, risks, and complications -Etiology of symptoms were discussed -X-rays were obtained and reviewed with the patient. Small Taylor's bunion is present. No evidence of acute fracture identified. -At today's appointment she is having no significant pains of a corticosteroid injection. -Discussed offloading past the areas will shoe gear modifications. -Prescribed mobic. Discussed side effects of the medication and directed to stop if any are to occur and call the office.  -She did ask about surgery but since this is her first flare up and she's had no conservative treatment will continue with this. In the future she may need to have this corrected. -Stretching exercises for the plantar fasciitis. Icing to the area daily. Also meloxicam will help with this hopefully.  -Discussed orthotics.  -Follow up with symptoms not resolve the next 2-3 weeks or sooner. Call any questions or concerns.  Celesta Gentile, DPM

## 2018-03-17 ENCOUNTER — Encounter: Payer: Self-pay | Admitting: Gastroenterology

## 2018-03-26 ENCOUNTER — Other Ambulatory Visit: Payer: Self-pay | Admitting: Obstetrics and Gynecology

## 2018-03-26 DIAGNOSIS — Z1231 Encounter for screening mammogram for malignant neoplasm of breast: Secondary | ICD-10-CM

## 2018-04-26 ENCOUNTER — Ambulatory Visit
Admission: RE | Admit: 2018-04-26 | Discharge: 2018-04-26 | Disposition: A | Discharge status: Home / Self Care | Payer: 59 | Source: Ambulatory Visit | Attending: Obstetrics and Gynecology | Admitting: Obstetrics and Gynecology

## 2018-04-26 DIAGNOSIS — Z1231 Encounter for screening mammogram for malignant neoplasm of breast: Secondary | ICD-10-CM

## 2018-10-25 DIAGNOSIS — M25511 Pain in right shoulder: Secondary | ICD-10-CM | POA: Insufficient documentation

## 2019-03-24 ENCOUNTER — Other Ambulatory Visit: Payer: Self-pay | Admitting: Obstetrics and Gynecology

## 2019-03-24 DIAGNOSIS — Z1231 Encounter for screening mammogram for malignant neoplasm of breast: Secondary | ICD-10-CM

## 2019-03-29 DIAGNOSIS — Z8541 Personal history of malignant neoplasm of cervix uteri: Secondary | ICD-10-CM | POA: Insufficient documentation

## 2019-03-29 DIAGNOSIS — I8390 Asymptomatic varicose veins of unspecified lower extremity: Secondary | ICD-10-CM | POA: Insufficient documentation

## 2019-05-10 ENCOUNTER — Ambulatory Visit: Payer: 59

## 2019-06-21 ENCOUNTER — Other Ambulatory Visit: Payer: Self-pay

## 2019-06-21 ENCOUNTER — Ambulatory Visit
Admission: RE | Admit: 2019-06-21 | Discharge: 2019-06-21 | Disposition: A | Discharge status: Home / Self Care | Payer: 59 | Source: Ambulatory Visit | Attending: Obstetrics and Gynecology | Admitting: Obstetrics and Gynecology

## 2019-06-21 DIAGNOSIS — Z1231 Encounter for screening mammogram for malignant neoplasm of breast: Secondary | ICD-10-CM

## 2020-04-18 ENCOUNTER — Other Ambulatory Visit: Payer: Self-pay | Admitting: Obstetrics and Gynecology

## 2020-04-18 DIAGNOSIS — Z1231 Encounter for screening mammogram for malignant neoplasm of breast: Secondary | ICD-10-CM

## 2020-06-21 ENCOUNTER — Other Ambulatory Visit: Payer: Self-pay

## 2020-06-21 ENCOUNTER — Ambulatory Visit
Admission: RE | Admit: 2020-06-21 | Discharge: 2020-06-21 | Disposition: A | Discharge status: Home / Self Care | Payer: 59 | Source: Ambulatory Visit | Attending: Obstetrics and Gynecology | Admitting: Obstetrics and Gynecology

## 2020-06-21 DIAGNOSIS — Z1231 Encounter for screening mammogram for malignant neoplasm of breast: Secondary | ICD-10-CM

## 2020-07-17 ENCOUNTER — Encounter: Payer: Self-pay | Admitting: Gastroenterology

## 2020-08-28 ENCOUNTER — Other Ambulatory Visit: Payer: Self-pay

## 2020-08-28 ENCOUNTER — Ambulatory Visit (AMBULATORY_SURGERY_CENTER): Payer: Self-pay | Admitting: *Deleted

## 2020-08-28 VITALS — Ht 62.0 in | Wt 127.0 lb

## 2020-08-28 DIAGNOSIS — Z1211 Encounter for screening for malignant neoplasm of colon: Secondary | ICD-10-CM

## 2020-08-28 MED ORDER — SUTAB 1479-225-188 MG PO TABS: 1.0000 | ORAL_TABLET | Freq: Once | ORAL | 0 refills | Status: AC

## 2020-08-28 NOTE — Progress Notes (Signed)

## 2020-08-29 ENCOUNTER — Encounter: Payer: Self-pay | Admitting: Gastroenterology

## 2020-09-12 ENCOUNTER — Ambulatory Visit (AMBULATORY_SURGERY_CENTER): Payer: 59 | Admitting: Gastroenterology

## 2020-09-12 ENCOUNTER — Encounter: Payer: Self-pay | Admitting: Gastroenterology

## 2020-09-12 ENCOUNTER — Other Ambulatory Visit: Payer: Self-pay

## 2020-09-12 VITALS — BP 133/82 | HR 57 | Temp 98.1°F | Resp 17 | Ht 62.0 in | Wt 127.0 lb

## 2020-09-12 DIAGNOSIS — Z1211 Encounter for screening for malignant neoplasm of colon: Secondary | ICD-10-CM | POA: Diagnosis present

## 2020-09-12 MED ORDER — SODIUM CHLORIDE 0.9 % IV SOLN: 500.0000 mL | Freq: Once | INTRAVENOUS | Status: DC

## 2020-09-12 NOTE — Patient Instructions (Signed)
Please read handouts provided. Continue present medications. Repeat colonoscopy in 10 years for screening purposes.    YOU HAD AN ENDOSCOPIC PROCEDURE TODAY AT THE [facility]:   Refer to the procedure report that was given to you for any specific questions about what was found during the examination.  If the procedure report does not answer your questions, please call your gastroenterologist to clarify.  If you requested that your care partner not be given the details of your procedure findings, then the procedure report has been included in a sealed envelope for you to review at your convenience later.  YOU SHOULD EXPECT: Some feelings of bloating in the abdomen. Passage of more gas than usual.  Walking can help get rid of the air that was put into your GI tract during the procedure and reduce the bloating. If you had a lower endoscopy (such as a colonoscopy or flexible sigmoidoscopy) you may notice spotting of blood in your stool or on the toilet paper. If you underwent a bowel prep for your procedure, you may not have a normal bowel movement for a few days.  Please Note:  You might notice some irritation and congestion in your nose or some drainage.  This is from the oxygen used during your procedure.  There is no need for concern and it should clear up in a day or so.  SYMPTOMS TO REPORT IMMEDIATELY:   Following lower endoscopy (colonoscopy or flexible sigmoidoscopy):  Excessive amounts of blood in the stool  Significant tenderness or worsening of abdominal pains  Swelling of the abdomen that is new, acute  Fever of 100F or higher   For urgent or emergent issues, a gastroenterologist can be reached at any hour by calling [phone]. Do not use MyChart messaging for urgent concerns.    DIET:  We do recommend a small meal at first, but then you may proceed to your regular diet.  Drink plenty of fluids but you should avoid alcoholic beverages for 24 hours.  ACTIVITY:   You should plan to take it easy for the rest of today and you should NOT DRIVE or use heavy machinery until tomorrow (because of the sedation medicines used during the test).    FOLLOW UP: Our staff will call the number listed on your records 48-72 hours following your procedure to check on you and address any questions or concerns that you may have regarding the information given to you following your procedure. If we do not reach you, we will leave a message.  We will attempt to reach you two times.  During this call, we will ask if you have developed any symptoms of COVID 19. If you develop any symptoms (ie: fever, flu-like symptoms, shortness of breath, cough etc.) before then, please call [phone].  If you test positive for Covid 19 in the 2 weeks post procedure, please call and report this information to us.    If any biopsies were taken you will be contacted by phone or by letter within the next 1-3 weeks.  Please call us at [phone] if you have not heard about the biopsies in 3 weeks.    SIGNATURES/CONFIDENTIALITY: You and/or your care partner have signed paperwork which will be entered into your electronic medical record.  These signatures attest to the fact that that the information above on your After Visit Summary has been reviewed and is understood.  Full responsibility of the confidentiality of this discharge information lies with you and/or your care-partner.   and/or your care-partner. 

## 2020-09-12 NOTE — Progress Notes (Signed)
Pt's states no medical or surgical changes since previsit or office visit. 

## 2020-09-12 NOTE — Op Note (Signed)
[place] Patient Name: Courtney [name] Procedure Date: 09/12/2020 10:35 AM MRN: [id number] Endoscopist: Ladene Artist , MD Age: 54 Referring MD:  Date of Birth: 1966-08-18 Gender: Female Account #: [id number] Procedure:                Colonoscopy Indications:              Screening for colorectal malignant neoplasm Medicines:                Monitored Anesthesia Care Procedure:                Pre-Anesthesia Assessment:                           - Prior to the procedure, a History and Physical                            was performed, and patient medications and                            allergies were reviewed. The patient's tolerance of                            previous anesthesia was also reviewed. The risks                            and benefits of the procedure and the sedation                            options and risks were discussed with the patient.                            All questions were answered, and informed consent                            was obtained. Prior Anticoagulants: The patient has                            taken no previous anticoagulant or antiplatelet                            agents. ASA Grade Assessment: II - A patient with                            mild systemic disease. After reviewing the risks                            and benefits, the patient was deemed in                            satisfactory condition to undergo the procedure.                           After obtaining informed consent, the colonoscope  was passed under direct vision. Throughout the                            procedure, the patient's blood pressure, pulse, and                            oxygen saturations were monitored continuously. The                            Colonoscope was introduced through the anus and                            advanced to the the cecum, identified by                            appendiceal orifice and  ileocecal valve. The                            ileocecal valve, appendiceal orifice, and rectum                            were photographed. The quality of the bowel                            preparation was good. The colonoscopy was performed                            without difficulty. The patient tolerated the                            procedure well. Scope In: 10:45:54 AM Scope Out: 11:03:53 AM Scope Withdrawal Time: 0 hours 13 minutes 21 seconds  Total Procedure Duration: 0 hours 17 minutes 59 seconds  Findings:                 The perianal and digital rectal examinations were                            normal.                           The entire examined colon appeared normal on direct                            and retroflexion views. Complications:            No immediate complications. Estimated blood loss:                            None. Estimated Blood Loss:     Estimated blood loss: none. Impression:               - The entire examined colon is normal on direct and                            retroflexion views.                           -  No specimens collected. Recommendation:           - Repeat colonoscopy in 10 years for screening                            purposes.                           - Patient has a contact number available for                            emergencies. The signs and symptoms of potential                            delayed complications were discussed with the                            patient. Return to normal activities tomorrow.                            Written discharge instructions were provided to the                            patient.                           - Resume previous diet.                           - Continue present medications. Ladene Artist, MD 09/12/2020 11:15:48 AM This report has been signed electronically.

## 2020-09-12 NOTE — Progress Notes (Signed)
VS taken by C.W. 

## 2020-09-14 ENCOUNTER — Telehealth: Payer: Self-pay | Admitting: *Deleted

## 2020-09-14 NOTE — Telephone Encounter (Signed)
Have you developed a fever since your procedure? no 2.   Have you had an respiratory symptoms (SOB or cough) since your procedure? no  3.   Have you tested positive for COVID 19 since your procedure no  4.   Have you had any family members/close contacts diagnosed with the COVID 19 since your procedure?  no   If yes to any of these questions please route to Joylene John, RN and Joella Prince, RN Follow up Call-  Call back number 09/12/2020  Post procedure Call Back phone  # [phone]  Permission to leave phone message Yes  Some recent data might be hidden     Patient questions:  Do you have a fever, pain , or abdominal swelling? No. Pain Score  0 *  Have you tolerated food without any problems? Yes.    Have you been able to return to your normal activities? Yes.    Do you have any questions about your discharge instructions: Diet   No. Medications  No. Follow up visit  No.  Do you have questions or concerns about your Care? No.  Actions: * If pain score is 4 or above: No action needed, pain <4.

## 2021-04-17 LAB — HM PAP SMEAR

## 2021-04-24 DIAGNOSIS — Z90721 Acquired absence of ovaries, unilateral: Secondary | ICD-10-CM | POA: Insufficient documentation

## 2021-05-20 ENCOUNTER — Other Ambulatory Visit: Payer: Self-pay

## 2021-05-20 ENCOUNTER — Encounter: Payer: Self-pay | Admitting: Internal Medicine

## 2021-05-20 ENCOUNTER — Ambulatory Visit: Payer: 59 | Admitting: Internal Medicine

## 2021-05-20 VITALS — BP 116/78 | HR 74 | Temp 98.3°F | Ht 61.5 in | Wt 131.0 lb

## 2021-05-20 DIAGNOSIS — R635 Abnormal weight gain: Secondary | ICD-10-CM | POA: Insufficient documentation

## 2021-05-20 DIAGNOSIS — Z0000 Encounter for general adult medical examination without abnormal findings: Secondary | ICD-10-CM | POA: Insufficient documentation

## 2021-05-20 DIAGNOSIS — B182 Chronic viral hepatitis C: Secondary | ICD-10-CM

## 2021-05-20 DIAGNOSIS — Z1159 Encounter for screening for other viral diseases: Secondary | ICD-10-CM

## 2021-05-20 DIAGNOSIS — Z23 Encounter for immunization: Secondary | ICD-10-CM

## 2021-05-20 LAB — HEPATIC FUNCTION PANEL
ALT: 11 U/L (ref 0–35)
AST: 16 U/L (ref 0–37)
Albumin: 4.4 g/dL (ref 3.5–5.2)
Alkaline Phosphatase: 64 U/L (ref 39–117)
Bilirubin, Direct: 0.1 mg/dL (ref 0.0–0.3)
Total Bilirubin: 0.5 mg/dL (ref 0.2–1.2)
Total Protein: 7 g/dL (ref 6.0–8.3)

## 2021-05-20 LAB — LIPID PANEL
Cholesterol: 179 mg/dL (ref 0–200)
HDL: 76.6 mg/dL (ref >39.00)
LDL Cholesterol: 91 mg/dL (ref 0–99)
NonHDL: 102.58
Total CHOL/HDL Ratio: 2
Triglycerides: 56 mg/dL (ref 0.0–149.0)
VLDL: 11.2 mg/dL (ref 0.0–40.0)

## 2021-05-20 LAB — TSH: TSH: 2.12 u[IU]/mL (ref 0.35–5.50)

## 2021-05-20 LAB — HEMOGLOBIN A1C: Hgb A1c MFr Bld: 5.5 % (ref 4.6–6.5)

## 2021-05-20 MED ORDER — SHINGRIX 50 MCG/0.5ML IM SUSR: 0.5000 mL | Freq: Once | INTRAMUSCULAR | 1 refills | Status: AC

## 2021-05-20 NOTE — Progress Notes (Signed)
Subjective:  Patient ID: Courtney [name], female    DOB: [birth year]  Age: 55 y.o. MRN: [id number]  CC: New Patient (Initial Visit) and Annual Exam  This visit occurred during the SARS-CoV-2 public health emergency.  Safety protocols were in place, including screening questions prior to the visit, additional usage of staff PPE, and extensive cleaning of exam room while observing appropriate contact time as indicated for disinfecting solutions.    HPI Courtney [name] presents for a CPX and to establish.  She complains of weight gain.  She exercises and does not experience chest pain, shortness of breath, palpitations, edema, or fatigue.  She has hot flashes and is using a low-dose estrogen patch.  History [place] has a past medical history of Cervical ca ([place]) and PAC (premature atrial contraction).   She has a past surgical history that includes Spider vein injection; Appendectomy; Abdominal hysterectomy; and Oophorectomy.   Her family history includes Breast cancer in her mother; Cancer in her father and mother; Colon polyps in her father and paternal grandfather; Varicose Veins in her mother.She reports that she has never smoked. She has never used smokeless tobacco. She reports current alcohol use of about 6.0 standard drinks of alcohol per week. She reports that she does not use drugs.  Outpatient Medications Prior to Visit  Medication Sig Dispense Refill   Cholecalciferol (VITAMIN D) 400 UNITS capsule Take 400 Units by mouth daily.     estradiol (VIVELLE-DOT) 0.025 MG/24HR Place onto the skin.     Multiple Vitamin (MULTIVITAMIN) tablet Take 1 tablet by mouth daily.     Omega-3 Fatty Acids (FISH OIL) 1200 MG CAPS Take by mouth 3 (three) times daily.     Probiotic Product (PROBIOTIC-10 PO) Take by mouth.     calcium gluconate 500 MG tablet Take 1 tablet by mouth 3 (three) times daily. (Patient not taking: Reported on 05/20/2021)     docusate sodium (COLACE) 100 MG capsule Take  100 mg by mouth 4 (four) times daily as needed for mild constipation. (Patient not taking: Reported on 05/20/2021)     loratadine (CLARITIN) 10 MG tablet Take 10 mg by mouth daily. (Patient not taking: No sig reported)     sertraline (ZOLOFT) 50 MG tablet Take 25 mg by mouth daily.     No facility-administered medications prior to visit.    ROS Review of Systems  Constitutional:  Positive for unexpected weight change (wt gain). Negative for diaphoresis and fatigue.  HENT: Negative.    Eyes: Negative.   Respiratory:  Negative for cough and shortness of breath.   Cardiovascular:  Negative for chest pain, palpitations and leg swelling.  Gastrointestinal:  Negative for abdominal pain, diarrhea, nausea and vomiting.  Endocrine: Negative.   Genitourinary: Negative.  Negative for difficulty urinating.  Musculoskeletal: Negative.  Negative for back pain and myalgias.  Skin: Negative.   Neurological: Negative.  Negative for dizziness, weakness and light-headedness.  Hematological: Negative.   Psychiatric/Behavioral: Negative.     Objective:  BP 116/78 (BP Location: Right Arm, Patient Position: Sitting, Cuff Size: Large)   Pulse 74   Temp 98.3 F (36.8 C) (Oral)   Ht 5' 1.5" (1.562 m)   Wt 131 lb (59.4 kg)   SpO2 97%   BMI 24.35 kg/m   Physical Exam Vitals reviewed.  HENT:     Nose: Nose normal.     Mouth/Throat:     Mouth: Mucous membranes are moist.  Eyes:     General: No scleral  icterus.    Conjunctiva/sclera: Conjunctivae normal.  Cardiovascular:     Rate and Rhythm: Normal rate and regular rhythm.     Heart sounds: No murmur heard. Pulmonary:     Effort: Pulmonary effort is normal.     Breath sounds: No stridor. No wheezing, rhonchi or rales.  Abdominal:     General: Abdomen is flat. Bowel sounds are normal. There is no distension.     Palpations: Abdomen is soft. There is no hepatomegaly, splenomegaly or mass.     Tenderness: There is no abdominal tenderness. There is  no guarding.  Musculoskeletal:        General: Normal range of motion.     Cervical back: Neck supple.     Right lower leg: No edema.     Left lower leg: No edema.  Lymphadenopathy:     Cervical: No cervical adenopathy.  Skin:    General: Skin is warm and dry.     Coloration: Skin is not pale.  Neurological:     General: No focal deficit present.     Mental Status: She is alert.  Psychiatric:        Mood and Affect: Mood normal.        Behavior: Behavior normal.    Lab Results  Component Value Date   WBC 9.6 05/12/2010   HGB 13.1 05/12/2010   HCT 38.3 05/12/2010   PLT 294 05/12/2010   GLUCOSE 87 05/12/2010   CHOL 179 05/20/2021   TRIG 56.0 05/20/2021   HDL 76.60 05/20/2021   LDLCALC 91 05/20/2021   ALT 11 05/20/2021   AST 16 05/20/2021   NA 137 05/12/2010   K 3.6 05/12/2010   CL 107 05/12/2010   CREATININE 0.69 05/12/2010   BUN 9 05/12/2010   CO2 22 05/12/2010   TSH 2.12 05/20/2021   HGBA1C 5.5 05/20/2021     Assessment & Plan:   Courtney [name] was seen today for new patient (initial visit) and annual exam.  Diagnoses and all orders for this visit:  Routine general medical examination at a health care facility- Exam completed, labs reviewed, vaccines reviewed and updated, cancer screenings are up-to-date, patient education material was given. -     Lipid panel; Future -     Hepatitis C antibody; Future -     HIV Antibody (routine testing w rflx); Future -     HIV Antibody (routine testing w rflx) -     Hepatitis C antibody -     Lipid panel  Need for shingles vaccine -     Zoster Vaccine Adjuvanted [facility]) injection; Inject 0.5 mLs into the muscle once for 1 dose.  Abnormal weight gain- Labs are negative for secondary causes or complications. -     Hemoglobin A1c; Future -     TSH; Future -     Hepatic function panel; Future -     Hepatic function panel -     TSH -     Hemoglobin A1c  Need for hepatitis C screening test -     Hepatitis C antibody;  Future -     Hepatitis C antibody  Chronic hepatitis C without hepatic coma (HCC) -     AMB referral to hepatitis C clinic  Other orders -     HCV RNA, Quantitative Real Time PCR  I have discontinued Carroll Sage. Courtney "KIM"'s calcium gluconate, docusate sodium, loratadine, and sertraline. I am also having her start on Shingrix. Additionally, I am having her maintain her  multivitamin, Vitamin D, Fish Oil, estradiol, and Probiotic Product (PROBIOTIC-10 PO).  Meds ordered this encounter  Medications   Zoster Vaccine Adjuvanted [facility]) injection    Sig: Inject 0.5 mLs into the muscle once for 1 dose.    Dispense:  0.5 mL    Refill:  1      Follow-up: Return in about 6 months (around 11/20/2021).  Scarlette Calico, MD

## 2021-05-20 NOTE — Patient Instructions (Signed)
Health Maintenance, Female Adopting a healthy lifestyle and getting preventive care are important in promoting health and wellness. Ask your health care provider about: The right schedule for you to have regular tests and exams. Things you can do on your own to prevent diseases and keep yourself healthy. What should I know about diet, weight, and exercise? Eat a healthy diet  Eat a diet that includes plenty of vegetables, fruits, low-fat dairy products, and lean protein. Do not eat a lot of foods that are high in solid fats, added sugars, or sodium.  Maintain a healthy weight Body mass index (BMI) is used to identify weight problems. It estimates body fat based on height and weight. Your health care provider can help determineyour BMI and help you achieve or maintain a healthy weight. Get regular exercise Get regular exercise. This is one of the most important things you can do for your health. Most adults should: Exercise for at least 150 minutes each week. The exercise should increase your heart rate and make you sweat (moderate-intensity exercise). Do strengthening exercises at least twice a week. This is in addition to the moderate-intensity exercise. Spend less time sitting. Even light physical activity can be beneficial. Watch cholesterol and blood lipids Have your blood tested for lipids and cholesterol at 53 years of age, then havethis test every 5 years. Have your cholesterol levels checked more often if: Your lipid or cholesterol levels are high. You are older than 53 years of age. You are at high risk for heart disease. What should I know about cancer screening? Depending on your health history and family history, you may need to have cancer screening at various ages. This may include screening for: Breast cancer. Cervical cancer. Colorectal cancer. Skin cancer. Lung cancer. What should I know about heart disease, diabetes, and high blood pressure? Blood pressure and heart  disease High blood pressure causes heart disease and increases the risk of stroke. This is more likely to develop in people who have high blood pressure readings, are of African descent, or are overweight. Have your blood pressure checked: Every 3-5 years if you are 18-39 years of age. Every year if you are 40 years old or older. Diabetes Have regular diabetes screenings. This checks your fasting blood sugar level. Have the screening done: Once every three years after age 40 if you are at a normal weight and have a low risk for diabetes. More often and at a younger age if you are overweight or have a high risk for diabetes. What should I know about preventing infection? Hepatitis B If you have a higher risk for hepatitis B, you should be screened for this virus. Talk with your health care provider to find out if you are at risk forhepatitis B infection. Hepatitis C Testing is recommended for: Everyone born from 1945 through 1965. Anyone with known risk factors for hepatitis C. Sexually transmitted infections (STIs) Get screened for STIs, including gonorrhea and chlamydia, if: You are sexually active and are younger than 53 years of age. You are older than 53 years of age and your health care provider tells you that you are at risk for this type of infection. Your sexual activity has changed since you were last screened, and you are at increased risk for chlamydia or gonorrhea. Ask your health care provider if you are at risk. Ask your health care provider about whether you are at high risk for HIV. Your health care provider may recommend a prescription medicine to help   prevent HIV infection. If you choose to take medicine to prevent HIV, you should first get tested for HIV. You should then be tested every 3 months for as long as you are taking the medicine. Pregnancy If you are about to stop having your period (premenopausal) and you may become pregnant, seek counseling before you get  pregnant. Take 400 to 800 micrograms (mcg) of folic acid every day if you become pregnant. Ask for birth control (contraception) if you want to prevent pregnancy. Osteoporosis and menopause Osteoporosis is a disease in which the bones lose minerals and strength with aging. This can result in bone fractures. If you are 65 years old or older, or if you are at risk for osteoporosis and fractures, ask your health care provider if you should: Be screened for bone loss. Take a calcium or vitamin D supplement to lower your risk of fractures. Be given hormone replacement therapy (HRT) to treat symptoms of menopause. Follow these instructions at home: Lifestyle Do not use any products that contain nicotine or tobacco, such as cigarettes, e-cigarettes, and chewing tobacco. If you need help quitting, ask your health care provider. Do not use street drugs. Do not share needles. Ask your health care provider for help if you need support or information about quitting drugs. Alcohol use Do not drink alcohol if: Your health care provider tells you not to drink. You are pregnant, may be pregnant, or are planning to become pregnant. If you drink alcohol: Limit how much you use to 0-1 drink a day. Limit intake if you are breastfeeding. Be aware of how much alcohol is in your drink. In the [place] one drink equals one 12 oz bottle of beer (355 mL), one 5 oz glass of wine (148 mL), or one 1 oz glass of hard liquor (44 mL). General instructions Schedule regular health, dental, and eye exams. Stay current with your vaccines. Tell your health care provider if: You often feel depressed. You have ever been abused or do not feel safe at home. Summary Adopting a healthy lifestyle and getting preventive care are important in promoting health and wellness. Follow your health care provider's instructions about healthy diet, exercising, and getting tested or screened for diseases. Follow your health care provider's  instructions on monitoring your cholesterol and blood pressure. This information is not intended to replace advice given to you by your health care provider. Make sure you discuss any questions you have with your healthcare provider. Document Revised: 10/06/2018 Document Reviewed: 10/06/2018 Elsevier Patient Education  2022 [employer] Inc[employer]  

## 2021-05-24 ENCOUNTER — Encounter: Payer: Self-pay | Admitting: Internal Medicine

## 2021-05-24 ENCOUNTER — Other Ambulatory Visit: Payer: Self-pay | Admitting: Obstetrics and Gynecology

## 2021-05-24 DIAGNOSIS — B182 Chronic viral hepatitis C: Secondary | ICD-10-CM | POA: Insufficient documentation

## 2021-05-24 DIAGNOSIS — Z1231 Encounter for screening mammogram for malignant neoplasm of breast: Secondary | ICD-10-CM

## 2021-05-24 DIAGNOSIS — Z1159 Encounter for screening for other viral diseases: Secondary | ICD-10-CM | POA: Insufficient documentation

## 2021-05-24 LAB — HCV RNA,QUANTITATIVE REAL TIME PCR
HCV Quantitative Log: 3.56 Log IU/mL — ABNORMAL HIGH
HCV RNA, PCR, QN: 3650 IU/mL — ABNORMAL HIGH

## 2021-05-24 LAB — HEPATITIS C ANTIBODY
Hepatitis C Ab: REACTIVE — AB
SIGNAL TO CUT-OFF: 26.9 — ABNORMAL HIGH (ref <1.00)

## 2021-05-24 LAB — HIV ANTIBODY (ROUTINE TESTING W REFLEX): HIV 1&2 Ab, 4th Generation: NONREACTIVE

## 2021-05-27 ENCOUNTER — Ambulatory Visit: Payer: 59 | Admitting: Obstetrics and Gynecology

## 2021-05-27 ENCOUNTER — Other Ambulatory Visit: Payer: Self-pay

## 2021-05-27 ENCOUNTER — Encounter: Payer: Self-pay | Admitting: Obstetrics and Gynecology

## 2021-05-27 VITALS — BP 122/68 | HR 66 | Ht 62.0 in | Wt 131.8 lb

## 2021-05-27 DIAGNOSIS — N951 Menopausal and female climacteric states: Secondary | ICD-10-CM

## 2021-05-27 DIAGNOSIS — Z7189 Other specified counseling: Secondary | ICD-10-CM

## 2021-05-27 NOTE — Patient Instructions (Signed)

## 2021-05-27 NOTE — Progress Notes (Signed)
55 y.o. G2P2. Married White or Caucasian Not Hispanic or Latino female here to establish care. [facility] She wants to talk about menopause symptoms. She was started on the 0.025 mg estradiol patch at that appointment. She states that she the patch has helped her so much.     She started having hot flashes ~2 years ago, they got really severe in the last year. She would wake up at night with hot flashes, not sweating.  She has had vaginal dryness, helped with lubricant.  She started on estradiol patch 0.025 mg, ~ 1 month ago.  She tried Zoloft prior to the estrogen patch. She felt the Zoloft helped her hot flashes some, but it muted her emotions. She also tried Wellbutrin and Effexor prior to ERT. Neither really helped.    She has a h/o cervical cancer, s/p laparoscopic radical hysterectomy in ~2006. About a year later she had an ovarian torsion, ended up having laparotomy with left oophorectomy and her appendix (reached across her pelvis and was attached to her left ovary).  Mom had breast cancer in her 85's.  No LMP recorded. Patient has had a hysterectomy.          Sexually active: Yes.    The current method of family planning is status post hysterectomy.    Exercising: Yes.     Walking [place]  Smoker:  no  Health Maintenance: [facility]:  20[facility]. History of abnormal Pap:  yes cervical cancer  MMG:  06/21/20 density C Bi-rads 1 neg  BMD:   none  Colonoscopy: 09/12/20 f/u 10 years  TDaP:  12/07/2017 Gardasil: n/a   reports that she has never smoked. She has never used smokeless tobacco. She reports current alcohol use of about 6.0 standard drinks of alcohol per week. She reports that she does not use drugs.  Past Medical History:  Diagnosis Date   Cervical ca (HCC)    PAC (premature atrial contraction)     Past Surgical History:  Procedure Laterality Date   ABDOMINAL HYSTERECTOMY     APPENDECTOMY     OOPHORECTOMY  Left    SPIDER VEIN INJECTION      Current Outpatient Medications  Medication Sig Dispense Refill   Cholecalciferol (VITAMIN D) 400 UNITS capsule Take 400 Units by mouth daily.     estradiol (VIVELLE-DOT) 0.025 MG/24HR Place onto the skin.     Multiple Vitamin (MULTIVITAMIN) tablet Take 1 tablet by mouth daily.     Omega-3 Fatty Acids (FISH OIL) 1200 MG CAPS Take by mouth 3 (three) times daily.     Probiotic Product (PROBIOTIC-10 PO) Take by mouth.     No current facility-administered medications for this visit.    Family History  Problem Relation Age of Onset   Cancer Mother    Varicose Veins Mother    Breast cancer Mother    Cancer Father        leukemia   Colon polyps Father    Colon polyps Paternal Grandfather    Colon cancer Neg Hx    Esophageal cancer Neg Hx    Stomach cancer Neg Hx    Rectal cancer Neg Hx   MGM with a hip fracture.   Review of Systems  All other systems reviewed and are negative.  Exam:   BP 122/68   Pulse 66   Ht '5\' 2"'$  (1.575 m)   Wt 131 lb 12.8 oz (59.8 kg)  SpO2 99%   BMI 24.11 kg/m   Weight change: '@WEIGHTCHANGE'$ @ Height:   Height: '5\' 2"'$  (157.5 cm)  Ht Readings from Last 3 Encounters:  05/27/21 '5\' 2"'$  (1.575 m)  05/20/21 5' 1.5" (1.562 m)  09/12/20 '5\' 2"'$  (1.575 m)    General appearance: alert, cooperative and appears stated age  55. Vasomotor symptoms due to menopause Much improved on her low dose estrogen patch  2. Counseling for estrogen replacement therapy Discussed the risks and benefits, she will continue on the low dose patch. Discussed breast self awareness, staying up to date on her mammograms

## 2021-06-01 ENCOUNTER — Encounter: Payer: Self-pay | Admitting: Internal Medicine

## 2021-06-03 ENCOUNTER — Other Ambulatory Visit: Payer: Self-pay | Admitting: Internal Medicine

## 2021-06-03 DIAGNOSIS — U071 COVID-19: Secondary | ICD-10-CM | POA: Insufficient documentation

## 2021-06-03 DIAGNOSIS — J208 Acute bronchitis due to other specified organisms: Secondary | ICD-10-CM | POA: Insufficient documentation

## 2021-06-03 MED ORDER — HYDROCODONE BIT-HOMATROP MBR 5-1.5 MG/5ML PO SOLN: 5.0000 mL | Freq: Four times a day (QID) | ORAL | 0 refills | Status: AC | PRN

## 2021-06-11 ENCOUNTER — Ambulatory Visit (INDEPENDENT_AMBULATORY_CARE_PROVIDER_SITE_OTHER): Payer: 59 | Admitting: Internal Medicine

## 2021-06-11 ENCOUNTER — Other Ambulatory Visit: Payer: Self-pay

## 2021-06-11 VITALS — BP 137/75 | HR 69 | Temp 98.1°F | Wt 130.0 lb

## 2021-06-11 DIAGNOSIS — B182 Chronic viral hepatitis C: Secondary | ICD-10-CM | POA: Diagnosis not present

## 2021-06-11 NOTE — Patient Instructions (Signed)
Please have your family members tested for hep c  If anyone in your family is also positive, we will start treating right away as that likely establishes that you might have had chronic hep c. If none is positive, will repeat your hep c testing in 10/2021 and decide if you need treatment at that time (25% of acute hep c exposure will clear on its own)  Avoid sharing of personal hygiene equipement  Avoid chronic alcohol use, or tylenol use > 6 tablets a day  Liver imaging to be ordered  Lab tests today

## 2021-06-11 NOTE — Progress Notes (Signed)
[facility]  Reason for Consult:chronic hep c evaluation Referring Provider: Ronnald Ramp (PCP)    Patient Active Problem List   Diagnosis Date Noted   Acute bronchitis due to COVID-19 virus 06/03/2021   Chronic hepatitis C without hepatic coma ([place]) 05/24/2021   Need for hepatitis C screening test 05/24/2021   Routine general medical examination at a health care facility 05/20/2021   Need for shingles vaccine 05/20/2021   Abnormal weight gain 05/20/2021   Shoulder pain, right 10/25/2018   Chronic venous insufficiency 12/01/2014   Varicose veins of both lower extremities 12/01/2014   BLOOD IN STOOL 04/25/2010   SMALL BOWEL OBSTRUCTION 12/18/2008      HPI: Courtney [name] is a 55 y.o. female referred by pcp for positive hep c ab/rna  Patient has a physical exam/labs 05/20/21. Her hep c ab/rna were positive; lft were normal however. She was referred here by her pcp for further evaluation  I reviewed chart/labs  Timing of diagnosis: Patient never tested before; this is the first time she knows about her hep c. She feels very shocked about the result.  Patient used to work for speech language pathology in 1997 for [employer]: no hx ivdu, indu, medical field job hx, kidney/dialysis issue, blood transfusion, tatoos, known exposure to closed co-habitant/relatives with hep c  Cirrhosis finding: no hx n/v/hematemesis, bloody/black stool, abd distension, fluid withdrawal from abd/lungs, hx confusion, fatigue, edema, weight loss, poor apetite  Extra gi manifestation: no abnormal skin rash, fatigue, diffuse joint pain/swelling, hx kidney disease    We reviewed: Natural history of hep c and its complications available treatment options for hepatitis C Other factors potentially worsening liver disease, including alcohol use; obesity; diabetes mellitus, and viral coinfection We discussed potential medications that can contribute to liver  inflammation like acetaminophen, and to avoiding excessive amount (more than 3 gram daily use) acetaminophen  Review of Systems: ROS All other ROS was negative    MEDS: Estradiol patch   Past Medical History:  Diagnosis Date   Cervical ca ([place])    PAC (premature atrial contraction)     Social History   Tobacco Use   Smoking status: Never   Smokeless tobacco: Never  Vaping Use   Vaping Use: Never used  Substance Use Topics   Alcohol use: Yes    Alcohol/week: 6.0 standard drinks    Types: 6 Glasses of wine per week   Drug use: Never    Family History  Problem Relation Age of Onset   Cancer Mother    Varicose Veins Mother    Breast cancer Mother    Cancer Father        leukemia   Colon polyps Father    Colon polyps Paternal Grandfather    Colon cancer Neg Hx    Esophageal cancer Neg Hx    Stomach cancer Neg Hx    Rectal cancer Neg Hx     Allergies  Allergen Reactions   Sulfonamide Derivatives     REACTION: rash    OBJECTIVE: There were no vitals filed for this visit. There is no height or weight on file to calculate BMI.   Physical Exam General/constitutional: no distress, pleasant HEENT: Normocephalic, PER, Conj Clear, EOMI, Oropharynx clear Neck supple CV: rrr no mrg Lungs: clear to auscultation, normal respiratory effort Abd: Soft, Nontender Ext: no edema Skin: No Rash Neuro: nonfocal MSK: no peripheral joint swelling/tenderness/warmth; back spines nontender Psych: alert/oriented  Lab: Lab Results  Component Value Date   WBC 9.6 05/12/2010   HGB 13.1 05/12/2010   HCT 38.3 05/12/2010   MCV 98.1 05/12/2010   PLT 294 50/72/2575   Last metabolic panel Lab Results  Component Value Date   GLUCOSE 87 05/12/2010   NA 137 05/12/2010   K 3.6 05/12/2010   CL 107 05/12/2010   CO2 22 05/12/2010   BUN 9 05/12/2010   CREATININE 0.69 05/12/2010   GFRNONAA >60 05/12/2010   GFRAA  05/12/2010    >60        The eGFR has been calculated using  the MDRD equation. This calculation has not been validated in all clinical situations. eGFR's persistently <60 mL/min signify possible Chronic Kidney Disease.   CALCIUM 8.9 05/12/2010   PROT 7.0 05/20/2021   ALBUMIN 4.4 05/20/2021   BILITOT 0.5 05/20/2021   ALKPHOS 64 05/20/2021   AST 16 05/20/2021   ALT 11 05/20/2021    Fib-4 score Will repeat lab for this visit  Microbiology:  Serology: 05/20/21 hep c ab positive; hcv rna 3.6k 05/20/21 hiv nonreactive  Imaging:   Assessment/plan: Problem List Items Addressed This Visit       Digestive   Chronic hepatitis C without hepatic coma ([place]) - Primary   Relevant Orders   Hepatitis A Ab, Total   Hepatitis B surface antibody,quantitative   [place] ELASTOGRAPHY LIVER   Hepatitis C genotype      #incidental hep c positive result Unclear chronicity No risk factors for acquiring hep c  Prior treatment: no GT: unknown Evidence of cirrhosis: no Interested in treatment: yes Potential DDI: none   -Will start screening for other hepatitis serology, getting basic labs, and liver health assessment.  -advise patient to have family members tested as well -will repeat hep c testing in January 2023, however, if anyone in the family is also positive, will repeat testing then and treat     -Labs: hep a/b, hep c genotype -Imaging: elastography -follow up: 10/2021 -Meds planned: mavyret 8 weeks if we treat  -discussed natural progression of hep c, transmission (avoid sharing personal hygiene equipment) -discussed avoid toxin like etoh and excessive acetamaminphen (no more than 2 gram a day) -discussed healthy life style and good glucose control -discussed avoiding eating raw sea food -discussed we can treat hep c but can be reinfected -discussed hepatitis coinfection and vaccination       Follow-up: Return in about 5 months (around 11/11/2021).   I spent 60 minute reviewing data/chart, and coordinating care and >50% direct  face to face time providing counseling/discussing diagnostics/treatment plan with patient   Jabier Mutton, [place] [facility] [place] [phone] pager   [phone] cell 06/11/2021, 10:58 AM

## 2021-06-14 ENCOUNTER — Ambulatory Visit (HOSPITAL_COMMUNITY)
Admission: RE | Admit: 2021-06-14 | Discharge: 2021-06-14 | Disposition: A | Discharge status: Home / Self Care | Payer: 59 | Source: Ambulatory Visit | Attending: Internal Medicine | Admitting: Internal Medicine

## 2021-06-14 ENCOUNTER — Other Ambulatory Visit: Payer: Self-pay

## 2021-06-14 DIAGNOSIS — B182 Chronic viral hepatitis C: Secondary | ICD-10-CM | POA: Diagnosis present

## 2021-06-15 LAB — HEPATITIS A ANTIBODY, TOTAL: Hepatitis A AB,Total: NONREACTIVE

## 2021-06-15 LAB — HEPATITIS C GENOTYPE: HCV Genotype: NOT DETECTED

## 2021-06-15 LAB — HEPATITIS B SURFACE ANTIBODY, QUANTITATIVE: Hep B S AB Quant (Post): 39 m[IU]/mL (ref >=10)

## 2021-06-18 ENCOUNTER — Other Ambulatory Visit: Payer: Self-pay | Admitting: Internal Medicine

## 2021-06-18 DIAGNOSIS — B182 Chronic viral hepatitis C: Secondary | ICD-10-CM

## 2021-06-18 NOTE — Progress Notes (Signed)
Labs hep c rna and cbc, cmp, inr ordered for 10/2021 id f/u  Can do mid 10/2021  Should f/u id clinic 11/2021

## 2021-07-15 ENCOUNTER — Ambulatory Visit
Admission: RE | Admit: 2021-07-15 | Discharge: 2021-07-15 | Disposition: A | Discharge status: Home / Self Care | Payer: 59 | Source: Ambulatory Visit

## 2021-07-15 ENCOUNTER — Other Ambulatory Visit: Payer: Self-pay

## 2021-07-15 ENCOUNTER — Other Ambulatory Visit: Payer: Self-pay | Admitting: Obstetrics and Gynecology

## 2021-07-15 DIAGNOSIS — Z1231 Encounter for screening mammogram for malignant neoplasm of breast: Secondary | ICD-10-CM

## 2021-07-18 ENCOUNTER — Other Ambulatory Visit: Payer: Self-pay | Admitting: Obstetrics and Gynecology

## 2021-07-18 DIAGNOSIS — R928 Other abnormal and inconclusive findings on diagnostic imaging of breast: Secondary | ICD-10-CM

## 2021-07-31 ENCOUNTER — Other Ambulatory Visit: Payer: Self-pay

## 2021-07-31 ENCOUNTER — Other Ambulatory Visit: Payer: Self-pay | Admitting: Obstetrics and Gynecology

## 2021-07-31 ENCOUNTER — Ambulatory Visit
Admission: RE | Admit: 2021-07-31 | Discharge: 2021-07-31 | Disposition: A | Discharge status: Home / Self Care | Payer: 59 | Source: Ambulatory Visit | Attending: Obstetrics and Gynecology | Admitting: Obstetrics and Gynecology

## 2021-07-31 DIAGNOSIS — R928 Other abnormal and inconclusive findings on diagnostic imaging of breast: Secondary | ICD-10-CM

## 2021-07-31 DIAGNOSIS — N6489 Other specified disorders of breast: Secondary | ICD-10-CM

## 2021-08-02 ENCOUNTER — Ambulatory Visit
Admission: RE | Admit: 2021-08-02 | Discharge: 2021-08-02 | Disposition: A | Discharge status: Home / Self Care | Payer: 59 | Source: Ambulatory Visit | Attending: Obstetrics and Gynecology | Admitting: Obstetrics and Gynecology

## 2021-08-02 ENCOUNTER — Other Ambulatory Visit: Payer: Self-pay

## 2021-08-02 DIAGNOSIS — N6489 Other specified disorders of breast: Secondary | ICD-10-CM

## 2021-08-02 HISTORY — PX: BREAST BIOPSY: SHX20

## 2021-08-11 ENCOUNTER — Encounter: Payer: Self-pay | Admitting: Obstetrics and Gynecology

## 2021-08-12 NOTE — Telephone Encounter (Signed)
Dr.Jertson patient  

## 2021-08-14 DIAGNOSIS — N6489 Other specified disorders of breast: Secondary | ICD-10-CM | POA: Insufficient documentation

## 2021-08-27 ENCOUNTER — Other Ambulatory Visit: Payer: Self-pay | Admitting: General Surgery

## 2021-08-27 ENCOUNTER — Ambulatory Visit: Payer: Self-pay | Admitting: General Surgery

## 2021-08-27 DIAGNOSIS — N6489 Other specified disorders of breast: Secondary | ICD-10-CM

## 2021-09-05 ENCOUNTER — Encounter (HOSPITAL_BASED_OUTPATIENT_CLINIC_OR_DEPARTMENT_OTHER): Payer: Self-pay | Admitting: General Surgery

## 2021-09-10 ENCOUNTER — Ambulatory Visit
Admission: RE | Admit: 2021-09-10 | Discharge: 2021-09-10 | Disposition: A | Discharge status: Home / Self Care | Payer: 59 | Source: Ambulatory Visit | Attending: General Surgery | Admitting: General Surgery

## 2021-09-10 ENCOUNTER — Other Ambulatory Visit: Payer: Self-pay

## 2021-09-10 DIAGNOSIS — N6489 Other specified disorders of breast: Secondary | ICD-10-CM

## 2021-09-10 NOTE — Progress Notes (Signed)

## 2021-09-12 ENCOUNTER — Ambulatory Visit
Admission: RE | Admit: 2021-09-12 | Discharge: 2021-09-12 | Disposition: A | Discharge status: Home / Self Care | Payer: 59 | Source: Ambulatory Visit | Attending: General Surgery | Admitting: General Surgery

## 2021-09-12 ENCOUNTER — Ambulatory Visit (HOSPITAL_BASED_OUTPATIENT_CLINIC_OR_DEPARTMENT_OTHER): Payer: 59 | Admitting: Anesthesiology

## 2021-09-12 ENCOUNTER — Encounter (HOSPITAL_BASED_OUTPATIENT_CLINIC_OR_DEPARTMENT_OTHER): Payer: Self-pay | Admitting: General Surgery

## 2021-09-12 ENCOUNTER — Ambulatory Visit (HOSPITAL_BASED_OUTPATIENT_CLINIC_OR_DEPARTMENT_OTHER)
Admission: RE | Admit: 2021-09-12 | Discharge: 2021-09-12 | Disposition: A | Discharge status: Home / Self Care | Payer: 59 | Attending: General Surgery | Admitting: General Surgery

## 2021-09-12 ENCOUNTER — Encounter (HOSPITAL_BASED_OUTPATIENT_CLINIC_OR_DEPARTMENT_OTHER)
Admission: RE | Disposition: A | Discharge status: Home / Self Care | Payer: Self-pay | Source: Home / Self Care | Attending: General Surgery

## 2021-09-12 ENCOUNTER — Other Ambulatory Visit: Payer: Self-pay

## 2021-09-12 DIAGNOSIS — N6489 Other specified disorders of breast: Secondary | ICD-10-CM

## 2021-09-12 DIAGNOSIS — Z803 Family history of malignant neoplasm of breast: Secondary | ICD-10-CM | POA: Insufficient documentation

## 2021-09-12 DIAGNOSIS — N6082 Other benign mammary dysplasias of left breast: Secondary | ICD-10-CM | POA: Insufficient documentation

## 2021-09-12 DIAGNOSIS — B1920 Unspecified viral hepatitis C without hepatic coma: Secondary | ICD-10-CM | POA: Insufficient documentation

## 2021-09-12 DIAGNOSIS — B182 Chronic viral hepatitis C: Secondary | ICD-10-CM

## 2021-09-12 HISTORY — PX: BREAST LUMPECTOMY: SHX2

## 2021-09-12 HISTORY — PX: BREAST LUMPECTOMY WITH RADIOACTIVE SEED LOCALIZATION: SHX6424

## 2021-09-12 SURGERY — BREAST LUMPECTOMY WITH RADIOACTIVE SEED LOCALIZATION
Anesthesia: General | Site: Breast | Laterality: Left

## 2021-09-12 MED ORDER — AMISULPRIDE (ANTIEMETIC) 5 MG/2ML IV SOLN: 10.0000 mg | Freq: Once | INTRAVENOUS | Status: DC | PRN

## 2021-09-12 MED ORDER — GABAPENTIN 300 MG PO CAPS
ORAL_CAPSULE | ORAL | Status: AC
  Filled 2021-09-12: qty 1

## 2021-09-12 MED ORDER — LACTATED RINGERS IV SOLN: INTRAVENOUS | Status: DC

## 2021-09-12 MED ORDER — CHLORHEXIDINE GLUCONATE CLOTH 2 % EX PADS: 6.0000 | MEDICATED_PAD | Freq: Once | CUTANEOUS | Status: DC

## 2021-09-12 MED ORDER — GABAPENTIN 300 MG PO CAPS
300.0000 mg | ORAL_CAPSULE | ORAL | Status: AC
  Administered 2021-09-12: 11:00:00 300 mg via ORAL

## 2021-09-12 MED ORDER — DEXAMETHASONE SODIUM PHOSPHATE 4 MG/ML IJ SOLN
INTRAMUSCULAR | Status: DC | PRN
  Administered 2021-09-12: 10 mg via INTRAVENOUS

## 2021-09-12 MED ORDER — ONDANSETRON HCL 4 MG/2ML IJ SOLN
INTRAMUSCULAR | Status: DC | PRN
  Administered 2021-09-12: 4 mg via INTRAVENOUS

## 2021-09-12 MED ORDER — OXYCODONE HCL 5 MG PO TABS: 5.0000 mg | ORAL_TABLET | Freq: Once | ORAL | Status: DC | PRN

## 2021-09-12 MED ORDER — ACETAMINOPHEN 500 MG PO TABS
ORAL_TABLET | ORAL | Status: AC
  Filled 2021-09-12: qty 2

## 2021-09-12 MED ORDER — EPHEDRINE SULFATE 50 MG/ML IJ SOLN
INTRAMUSCULAR | Status: DC | PRN
  Administered 2021-09-12: 10 mg via INTRAVENOUS

## 2021-09-12 MED ORDER — CEFAZOLIN SODIUM-DEXTROSE 2-4 GM/100ML-% IV SOLN
2.0000 g | INTRAVENOUS | Status: AC
  Administered 2021-09-12: 12:00:00 2 g via INTRAVENOUS

## 2021-09-12 MED ORDER — PROPOFOL 10 MG/ML IV BOLUS
INTRAVENOUS | Status: DC | PRN
  Administered 2021-09-12: 150 mg via INTRAVENOUS

## 2021-09-12 MED ORDER — LIDOCAINE HCL (CARDIAC) PF 100 MG/5ML IV SOSY
PREFILLED_SYRINGE | INTRAVENOUS | Status: DC | PRN
  Administered 2021-09-12: 50 mg via INTRAVENOUS

## 2021-09-12 MED ORDER — OXYCODONE HCL 5 MG/5ML PO SOLN: 5.0000 mg | Freq: Once | ORAL | Status: DC | PRN

## 2021-09-12 MED ORDER — HYDROMORPHONE HCL 1 MG/ML IJ SOLN: 0.2500 mg | INTRAMUSCULAR | Status: DC | PRN

## 2021-09-12 MED ORDER — PROMETHAZINE HCL 25 MG/ML IJ SOLN: 6.2500 mg | INTRAMUSCULAR | Status: DC | PRN

## 2021-09-12 MED ORDER — FENTANYL CITRATE (PF) 100 MCG/2ML IJ SOLN
INTRAMUSCULAR | Status: DC | PRN
  Administered 2021-09-12: 100 ug via INTRAVENOUS

## 2021-09-12 MED ORDER — HYDROCODONE-ACETAMINOPHEN 5-325 MG PO TABS: 1.0000 | ORAL_TABLET | Freq: Four times a day (QID) | ORAL | 0 refills | Status: DC | PRN

## 2021-09-12 MED ORDER — BUPIVACAINE-EPINEPHRINE (PF) 0.25% -1:200000 IJ SOLN
INTRAMUSCULAR | Status: DC | PRN
  Administered 2021-09-12: 30 mL via PERINEURAL

## 2021-09-12 MED ORDER — CEFAZOLIN SODIUM-DEXTROSE 2-4 GM/100ML-% IV SOLN
INTRAVENOUS | Status: AC
  Filled 2021-09-12: qty 100

## 2021-09-12 MED ORDER — ACETAMINOPHEN 500 MG PO TABS
1000.0000 mg | ORAL_TABLET | ORAL | Status: AC
  Administered 2021-09-12: 11:00:00 1000 mg via ORAL

## 2021-09-12 MED ORDER — MIDAZOLAM HCL 5 MG/5ML IJ SOLN
INTRAMUSCULAR | Status: DC | PRN
Start: 2021-09-12 — End: 2021-09-12
  Administered 2021-09-12: 2 mg via INTRAVENOUS

## 2021-09-12 MED ORDER — FENTANYL CITRATE (PF) 100 MCG/2ML IJ SOLN
INTRAMUSCULAR | Status: AC
  Filled 2021-09-12: qty 2

## 2021-09-12 MED ORDER — MIDAZOLAM HCL 2 MG/2ML IJ SOLN
INTRAMUSCULAR | Status: AC
  Filled 2021-09-12: qty 2

## 2021-09-12 MED ORDER — MEPERIDINE HCL 25 MG/ML IJ SOLN
6.2500 mg | INTRAMUSCULAR | Status: DC | PRN
Start: 2021-09-12 — End: 2021-09-12

## 2021-09-12 SURGICAL SUPPLY — 41 items
ADH SKN CLS APL DERMABOND .7 (GAUZE/BANDAGES/DRESSINGS) ×1
APL PRP STRL LF DISP 70% ISPRP (MISCELLANEOUS) ×1
APPLIER CLIP 9.375 MED OPEN (MISCELLANEOUS)
APR CLP MED 9.3 20 MLT OPN (MISCELLANEOUS)
BLADE SURG 15 STRL LF DISP TIS (BLADE) ×1 IMPLANT
BLADE SURG 15 STRL SS (BLADE) ×2
CANISTER SUC SOCK COL 7IN (MISCELLANEOUS) IMPLANT
CANISTER SUCT 1200ML W/VALVE (MISCELLANEOUS) ×2 IMPLANT
CHLORAPREP W/TINT 26 (MISCELLANEOUS) ×2 IMPLANT
CLIP APPLIE 9.375 MED OPEN (MISCELLANEOUS) IMPLANT
COVER BACK TABLE 60X90IN (DRAPES) ×2 IMPLANT
COVER MAYO STAND STRL (DRAPES) ×2 IMPLANT
COVER PROBE W GEL 5X96 (DRAPES) ×2 IMPLANT
DECANTER SPIKE VIAL GLASS SM (MISCELLANEOUS) IMPLANT
DERMABOND ADVANCED (GAUZE/BANDAGES/DRESSINGS) ×1
DERMABOND ADVANCED .7 DNX12 (GAUZE/BANDAGES/DRESSINGS) ×1 IMPLANT
DRAPE LAPAROSCOPIC ABDOMINAL (DRAPES) ×2 IMPLANT
DRAPE UTILITY XL STRL (DRAPES) ×2 IMPLANT
ELECT COATED BLADE 2.86 ST (ELECTRODE) ×2 IMPLANT
ELECT REM PT RETURN 9FT ADLT (ELECTROSURGICAL) ×2
ELECTRODE REM PT RTRN 9FT ADLT (ELECTROSURGICAL) ×1 IMPLANT
GLOVE SURG ENC MOIS LTX SZ7.5 (GLOVE) ×8 IMPLANT
GOWN STRL REUS W/ TWL LRG LVL3 (GOWN DISPOSABLE) ×2 IMPLANT
GOWN STRL REUS W/TWL LRG LVL3 (GOWN DISPOSABLE) ×4
ILLUMINATOR WAVEGUIDE N/F (MISCELLANEOUS) IMPLANT
KIT MARKER MARGIN INK (KITS) ×2 IMPLANT
LIGHT WAVEGUIDE WIDE FLAT (MISCELLANEOUS) IMPLANT
NEEDLE HYPO 25X1 1.5 SAFETY (NEEDLE) ×2 IMPLANT
NS IRRIG 1000ML POUR BTL (IV SOLUTION) ×2 IMPLANT
PACK BASIN DAY SURGERY FS (CUSTOM PROCEDURE TRAY) ×2 IMPLANT
PENCIL SMOKE EVACUATOR (MISCELLANEOUS) ×2 IMPLANT
SLEEVE SCD COMPRESS KNEE MED (STOCKING) ×2 IMPLANT
SPONGE T-LAP 18X18 ~~LOC~~+RFID (SPONGE) ×2 IMPLANT
SUT MON AB 4-0 PC3 18 (SUTURE) ×2 IMPLANT
SUT SILK 2 0 SH (SUTURE) IMPLANT
SUT VICRYL 3-0 CR8 SH (SUTURE) ×2 IMPLANT
SYR CONTROL 10ML LL (SYRINGE) IMPLANT
TOWEL GREEN STERILE FF (TOWEL DISPOSABLE) ×2 IMPLANT
TRAY FAXITRON CT DISP (TRAY / TRAY PROCEDURE) ×2 IMPLANT
TUBE CONNECTING 20X1/4 (TUBING) ×2 IMPLANT
YANKAUER SUCT BULB TIP NO VENT (SUCTIONS) IMPLANT

## 2021-09-12 NOTE — Discharge Instructions (Signed)
May take Tylenol after 5pm, if needed.    Post Anesthesia Home Care Instructions  Activity: Get plenty of rest for the remainder of the day. A responsible individual must stay with you for 24 hours following the procedure.  For the next 24 hours, DO NOT: -Drive a car -Paediatric nurse -Drink alcoholic beverages -Take any medication unless instructed by your physician -Make any legal decisions or sign important papers.  Meals: Start with liquid foods such as gelatin or soup. Progress to regular foods as tolerated. Avoid greasy, spicy, heavy foods. If nausea and/or vomiting occur, drink only clear liquids until the nausea and/or vomiting subsides. Call your physician if vomiting continues.  Special Instructions/Symptoms: Your throat may feel dry or sore from the anesthesia or the breathing tube placed in your throat during surgery. If this causes discomfort, gargle with warm salt water. The discomfort should disappear within 24 hours.  If you had a scopolamine patch placed behind your ear for the management of post- operative nausea and/or vomiting:  1. The medication in the patch is effective for 72 hours, after which it should be removed.  Wrap patch in a tissue and discard in the trash. Wash hands thoroughly with soap and water. 2. You may remove the patch earlier than 72 hours if you experience unpleasant side effects which may include dry mouth, dizziness or visual disturbances. 3. Avoid touching the patch. Wash your hands with soap and water after contact with the patch.

## 2021-09-12 NOTE — Interval H&P Note (Signed)
History and Physical Interval Note:  09/12/2021 12:14 PM  Courtney [name]  has presented today for surgery, with the diagnosis of LEFT BREAST CSL.  The various methods of treatment have been discussed with the patient and family. After consideration of risks, benefits and other options for treatment, the patient has consented to  Procedure(s): LEFT BREAST LUMPECTOMY WITH RADIOACTIVE SEED LOCALIZATION (Left) as a surgical intervention.  The patient's history has been reviewed, patient examined, no change in status, stable for surgery.  I have reviewed the patient's chart and labs.  Questions were answered to the patient's satisfaction.     Autumn Messing III

## 2021-09-12 NOTE — H&P (Signed)
REFERRING PHYSICIAN: Salvadore Dom, MD  PROVIDER: Landry Corporal, MD  MRN: [id number] DOB: 03/22/1966 Subjective  Chief Complaint: No chief complaint on file.   History of Present Illness: Courtney [name] is a 55 y.o. female who is seen today as an office consultation at the request of Dr. Talbert Nan for evaluation of No chief complaint on file. .   We are asked to see the patient in consultation by Dr. Talbert Nan to evaluate her for a complex sclerosing lesion in the left breast. The patient is a 55 year old white female who recently went for a routine screening mammogram. At that time she was found to have an area of distortion in the lateral left breast that measured approximately 2 cm. This area was biopsied and came back as a complex sclerosing lesion. She has a family history significant for breast cancer in her mother. She is otherwise in good health. She does not smoke.  Review of Systems: A complete review of systems was obtained from the patient. I have reviewed this information and discussed as appropriate with the patient. See HPI as well for other ROS.  ROS   Medical History: Past Medical History:  Diagnosis Date   History of cancer   Patient Active Problem List  Diagnosis   Complex sclerosing lesion of left breast   Past Surgical History:  Procedure Laterality Date   HYSTERECTOMY    Allergies  Allergen Reactions   Sulfa (Sulfonamide Antibiotics) Rash   Current Outpatient Medications on File Prior to Visit  Medication Sig Dispense Refill   estradiol (VIVELLE-DOT) patch 0.025 mg/24 hr   No current facility-administered medications on file prior to visit.   Family History  Problem Relation Age of Onset   Breast cancer Mother   Hyperlipidemia (Elevated cholesterol) Father    Social History   Tobacco Use  Smoking Status Never Smoker  Smokeless Tobacco Never Used    Social History   Socioeconomic History   Marital status: Married  Tobacco Use    Smoking status: Never Smoker   Smokeless tobacco: Never Used  Substance and Sexual Activity   Alcohol use: Yes   Drug use: Never   Objective:   Vitals:  BP: 126/84  Pulse: 79  Weight: 60.1 kg (132 lb 6.4 oz)  Height: 157.5 cm (5\' 2" )   Body mass index is 24.22 kg/m.  Physical Exam Vitals reviewed.  Constitutional:  General: She is not in acute distress. Appearance: Normal appearance.  HENT:  Head: Normocephalic and atraumatic.  Right Ear: External ear normal.  Left Ear: External ear normal.  Nose: Nose normal.  Mouth/Throat:  Mouth: Mucous membranes are moist.  Pharynx: Oropharynx is clear.  Eyes:  General: No scleral icterus. Extraocular Movements: Extraocular movements intact.  Conjunctiva/sclera: Conjunctivae normal.  Pupils: Pupils are equal, round, and reactive to light.  Cardiovascular:  Rate and Rhythm: Normal rate and regular rhythm.  Pulses: Normal pulses.  Heart sounds: Normal heart sounds.  Pulmonary:  Effort: Pulmonary effort is normal. No respiratory distress.  Breath sounds: Normal breath sounds.  Abdominal:  General: Bowel sounds are normal.  Palpations: Abdomen is soft.  Tenderness: There is no abdominal tenderness.  Musculoskeletal:  General: No swelling, tenderness or deformity. Normal range of motion.  Cervical back: Normal range of motion and neck supple.  Skin: General: Skin is warm and dry.  Coloration: Skin is not jaundiced.  Neurological:  General: No focal deficit present.  Mental Status: She is alert and oriented to person, place, and time.  Psychiatric:  Mood and Affect: Mood normal.  Behavior: Behavior normal.     Breast: There is no palpable mass in either breast. There is no palpable axillary, supraclavicular, or cervical lymphadenopathy.  Labs, Imaging and Diagnostic Testing:  Assessment and Plan:  Diagnoses and all orders for this visit:  Complex sclerosing lesion of left breast - [facility] Case Posting Request;  Future    The patient appears to have a 2 cm area of complex sclerosing lesion in the lateral left breast. Because of its size and because of her family history of breast cancer my recommendation would be to have this area removed. She would also like to have this done. I have discussed with her in detail the risks and benefits of the operation as well as some of the technical aspects including the use of a radioactive seed for localization and she understands and wishes to proceed. The presence of this lesion and her family history puts her lifetime risk of breast cancer at about 17% which is still under the 20% needed to be considered to high risk.

## 2021-09-12 NOTE — Op Note (Signed)
09/12/2021  1:01 PM  PATIENT:  Courtney [name]  55 y.o. female  PRE-OPERATIVE DIAGNOSIS:  LEFT BREAST CSL  POST-OPERATIVE DIAGNOSIS:  LEFT BREAST CSL  PROCEDURE:  Procedure(s): LEFT BREAST LUMPECTOMY WITH RADIOACTIVE SEED LOCALIZATION (Left)  SURGEON:  Surgeon(s) and Role:    * Jovita Kussmaul, MD - Primary  PHYSICIAN ASSISTANT:   ASSISTANTS: none   ANESTHESIA:   local and general  EBL:  5 mL   BLOOD ADMINISTERED:none  DRAINS: none   LOCAL MEDICATIONS USED:  MARCAINE     SPECIMEN:  Source of Specimen:  left breast tissue with additional inferior/deep, superior, and deep margins  DISPOSITION OF SPECIMEN:  PATHOLOGY  COUNTS:  YES  TOURNIQUET:  * No tourniquets in log *  DICTATION: .Dragon Dictation  After informed consent was obtained the patient was brought to the operating room and placed in the supine position on the operating table.  After adequate induction of general anesthesia the patient's left breast was prepped with ChloraPrep, allowed to dry, and draped in usual sterile manner.  An appropriate timeout was performed.  Previously an I-125 seed was placed in the lower outer quadrant of the left breast to mark an area of complex sclerosing lesion.  The neoprobe was set to I-125 in the area of radioactivity was readily identified.  The area around this was infiltrated with quarter percent Marcaine.  A curvilinear incision was made along the lower outer edge of the areola of the left breast.  The incision was carried through the skin and subcutaneous tissue sharply with the electrocautery.  Dissection was then carried towards the radioactive seed under the direction of the neoprobe.  Once I more closely approached the radioactive seed I then removed a circular portion of breast tissue sharply with the electrocautery around the radioactive seed while checking the area of radioactivity frequently.  Once the specimen was removed it was oriented with the appropriate paint  colors.  A specimen radiograph was obtained that showed the seed but no clip within the specimen.  After reviewing the images with radiology the clip is seen to be directly posterior to the seed on both views.  I took an additional inferior and deep margin as well as a superior margin and a dedicated deep margin that went all the way to the muscle of the chest wall.  Specimen radiographs were obtained of all of these specimens and we still could not find the clip.  At this point the breast tissue that remained felt normal and fatty in nature.  There were no other abnormal feeling areas of breast tissue that would warrant removal.  At this point we decided to stop removing more tissue and see what the pathology findings.  The wound was irrigated with saline and infiltrated with more quarter percent Marcaine.  The deep layer of the wound was then closed with layers of interrupted 3-0 Vicryl stitches.  The skin was then closed with interrupted 4-0 Monocryl subcuticular stitches.  Dermabond dressings were applied.  The patient tolerated the procedure well.  At the end of the case all needle sponge and instrument counts were correct.  The patient was then awakened and taken to recovery in stable condition.  PLAN OF CARE: Discharge to home after PACU  PATIENT DISPOSITION:  PACU - hemodynamically stable.   Delay start of Pharmacological VTE agent (>24hrs) due to surgical blood loss or risk of bleeding: not applicable

## 2021-09-12 NOTE — Transfer of Care (Signed)
Immediate Anesthesia Transfer of Care Note  Patient: Courtney [name]  Procedure(s) Performed: LEFT BREAST LUMPECTOMY WITH RADIOACTIVE SEED LOCALIZATION (Left: Breast)  Patient Location: PACU  Anesthesia Type:General  Level of Consciousness: awake, alert  and oriented  Airway & Oxygen Therapy: Patient Spontanous Breathing and Patient connected to face mask oxygen  Post-op Assessment: Report given to RN and Post -op Vital signs reviewed and stable  Post vital signs: Reviewed and stable  Last Vitals:  Vitals Value Taken Time  BP    Temp    Pulse 97 09/12/21 1311  Resp    SpO2 99 % 09/12/21 1311  Vitals shown include unvalidated device data.  Last Pain:  Vitals:   09/12/21 1104  TempSrc: Oral  PainSc: 0-No pain         Complications: No notable events documented.

## 2021-09-12 NOTE — Anesthesia Preprocedure Evaluation (Signed)
Anesthesia Evaluation  Patient identified by MRN, date of birth, ID band Patient awake    Reviewed: Allergy & Precautions, H&P , NPO status , Patient's Chart, lab work & pertinent test results  Airway Mallampati: II  TM Distance: >3 FB Neck ROM: Full    Dental no notable dental hx.    Pulmonary neg pulmonary ROS,    Pulmonary exam normal breath sounds clear to auscultation       Cardiovascular negative cardio ROS Normal cardiovascular exam Rhythm:Regular Rate:Normal     Neuro/Psych negative neurological ROS  negative psych ROS   GI/Hepatic negative GI ROS, (+) Hepatitis -, C  Endo/Other  negative endocrine ROS  Renal/GU negative Renal ROS  negative genitourinary   Musculoskeletal negative musculoskeletal ROS (+)   Abdominal   Peds negative pediatric ROS (+)  Hematology negative hematology ROS (+)   Anesthesia Other Findings   Reproductive/Obstetrics negative OB ROS                             Anesthesia Physical Anesthesia Plan  ASA: 2  Anesthesia Plan: General   Post-op Pain Management:    Induction: Intravenous  PONV Risk Score and Plan: 3 and Ondansetron, Dexamethasone, Midazolam and Treatment may vary due to age or medical condition  Airway Management Planned: LMA  Additional Equipment:   Intra-op Plan:   Post-operative Plan: Extubation in OR  Informed Consent: I have reviewed the patients History and Physical, chart, labs and discussed the procedure including the risks, benefits and alternatives for the proposed anesthesia with the patient or authorized representative who has indicated his/her understanding and acceptance.     Dental advisory given  Plan Discussed with: CRNA  Anesthesia Plan Comments:         Anesthesia Quick Evaluation

## 2021-09-12 NOTE — Anesthesia Postprocedure Evaluation (Signed)
Anesthesia Post Note  Patient: Courtney [name]  Procedure(s) Performed: LEFT BREAST LUMPECTOMY WITH RADIOACTIVE SEED LOCALIZATION (Left: Breast)     Patient location during evaluation: PACU Anesthesia Type: General Level of consciousness: awake and alert, oriented and patient cooperative Pain management: pain level controlled Vital Signs Assessment: post-procedure vital signs reviewed and stable Respiratory status: spontaneous breathing, nonlabored ventilation and respiratory function stable Cardiovascular status: blood pressure returned to baseline and stable Postop Assessment: no apparent nausea or vomiting Anesthetic complications: no   No notable events documented.  Last Vitals:  Vitals:   09/12/21 1312 09/12/21 1315  BP: 129/72 128/83  Pulse: 97 86  Resp: 16 12  Temp: (!) 36.1 C   SpO2: 99% 100%    Last Pain:  Vitals:   09/12/21 1312  TempSrc:   PainSc: 0-No pain                 Pervis Hocking

## 2021-09-12 NOTE — Anesthesia Procedure Notes (Signed)
Procedure Name: LMA Insertion Date/Time: 09/12/2021 12:12 PM Performed by: Willa Frater, CRNA Pre-anesthesia Checklist: Patient identified, Emergency Drugs available, Suction available and Patient being monitored Patient Re-evaluated:Patient Re-evaluated prior to induction Oxygen Delivery Method: Circle system utilized Preoxygenation: Pre-oxygenation with 100% oxygen Induction Type: IV induction Ventilation: Mask ventilation without difficulty LMA: LMA inserted LMA Size: 3.0 Number of attempts: 1 Airway Equipment and Method: Bite block Placement Confirmation: positive ETCO2 Tube secured with: Tape Dental Injury: Teeth and Oropharynx as per pre-operative assessment

## 2021-09-13 ENCOUNTER — Encounter (HOSPITAL_BASED_OUTPATIENT_CLINIC_OR_DEPARTMENT_OTHER): Payer: Self-pay | Admitting: General Surgery

## 2021-09-16 LAB — SURGICAL PATHOLOGY

## 2021-10-22 ENCOUNTER — Other Ambulatory Visit: Payer: Self-pay

## 2021-10-22 ENCOUNTER — Other Ambulatory Visit: Payer: 59

## 2021-10-22 DIAGNOSIS — B182 Chronic viral hepatitis C: Secondary | ICD-10-CM

## 2021-10-27 LAB — CBC WITH DIFFERENTIAL/PLATELET
Absolute Monocytes: 445 cells/uL (ref 200–950)
Basophils Absolute: 42 cells/uL (ref 0–200)
Basophils Relative: 0.8 %
Eosinophils Absolute: 201 cells/uL (ref 15–500)
Eosinophils Relative: 3.8 %
HCT: 38 % (ref 35.0–45.0)
Hemoglobin: 12.9 g/dL (ref 11.7–15.5)
Lymphs Abs: 1818 cells/uL (ref 850–3900)
MCH: 32.7 pg (ref 27.0–33.0)
MCHC: 33.9 g/dL (ref 32.0–36.0)
MCV: 96.4 fL (ref 80.0–100.0)
MPV: 8.8 fL (ref 7.5–12.5)
Monocytes Relative: 8.4 %
Neutro Abs: 2793 cells/uL (ref 1500–7800)
Neutrophils Relative %: 52.7 %
Platelets: 367 10*3/uL (ref 140–400)
RBC: 3.94 10*6/uL (ref 3.80–5.10)
RDW: 12.3 % (ref 11.0–15.0)
Total Lymphocyte: 34.3 %
WBC: 5.3 10*3/uL (ref 3.8–10.8)

## 2021-10-27 LAB — HEPATITIS C RNA QUANTITATIVE
HCV Quantitative Log: <1.18 log IU/mL
HCV RNA, PCR, QN: <15 IU/mL

## 2021-10-27 LAB — PROTIME-INR
INR: 1.1
Prothrombin Time: 10.8 s (ref 9.0–11.5)

## 2021-10-27 LAB — HEPATITIS C GENOTYPE: HCV Genotype: NOT DETECTED

## 2021-11-04 ENCOUNTER — Other Ambulatory Visit: Payer: Self-pay

## 2021-11-04 ENCOUNTER — Ambulatory Visit: Payer: 59 | Admitting: Internal Medicine

## 2021-11-04 VITALS — BP 140/78 | HR 72 | Temp 98.2°F | Wt 138.0 lb

## 2021-11-04 DIAGNOSIS — B1710 Acute hepatitis C without hepatic coma: Secondary | ICD-10-CM

## 2021-11-04 NOTE — Progress Notes (Signed)
°  ° ° ° ° °[facility] ° °Reason for Consult:chronic hep c evaluation °Referring Provider: Jones (PCP) ° ° ° °Patient Active Problem List  ° Diagnosis Date Noted  ° Acute bronchitis due to COVID-19 virus 06/03/2021  ° Chronic hepatitis C without hepatic coma (HCC) 05/24/2021  ° Need for hepatitis C screening test 05/24/2021  ° Routine general medical examination at a health care facility 05/20/2021  ° Need for shingles vaccine 05/20/2021  ° Abnormal weight gain 05/20/2021  ° Shoulder pain, right 10/25/2018  ° Chronic venous insufficiency 12/01/2014  ° Varicose veins of both lower extremities 12/01/2014  ° BLOOD IN STOOL 04/25/2010  ° SMALL BOWEL OBSTRUCTION 12/18/2008  ° ° ° ° °HPI: Courtney [name] is a 55 y.o. female referred by pcp for positive hep c ab/rna ° °Patient has a physical exam/labs 05/20/21. Her hep c ab/rna were positive; lft were normal however. She was referred here by her pcp for further evaluation ° °I reviewed chart/labs ° °Timing of diagnosis: °Patient never tested before; this is the first time she knows about her hep c. She feels very shocked about the result.  °Patient used to work for speech language pathology in 1997 for [employer] ° °Risk: no hx ivdu, indu, medical field job hx, kidney/dialysis issue, blood transfusion, tatoos, known exposure to closed co-habitant/relatives with hep c ° °Cirrhosis finding: no hx n/v/hematemesis, bloody/black stool, abd distension, fluid withdrawal from abd/lungs, hx confusion, fatigue, edema, weight loss, poor apetite ° °Extra gi manifestation: no abnormal skin rash, fatigue, diffuse joint pain/swelling, hx kidney disease ° ° ° °We reviewed: °Natural history of hep c and its complications °available treatment options for hepatitis C °Other factors potentially worsening liver disease, including alcohol use; obesity; diabetes mellitus, and viral coinfection °We discussed potential medications that can contribute to liver  inflammation like acetaminophen, and to avoiding excessive amount (more than 3 gram daily use) acetaminophen ° ° °-------------- °11/04/21 id f/u °Repeat hep c rna negative. °Still unclear where/when she was exposed. Her family all tested negative °No other issues ° °Review of Systems: °ROS °All other ROS was negative  ° ° °MEDS: °Estradiol patch ° ° °Past Medical History:  °Diagnosis Date  ° Cervical ca (HCC)   ° PAC (premature atrial contraction)   ° ° °Social History  ° °Tobacco Use  ° Smoking status: Never  ° Smokeless tobacco: Never  °Vaping Use  ° Vaping Use: Never used  °Substance Use Topics  ° Alcohol use: Yes  °  Alcohol/week: 6.0 standard drinks  °  Types: 6 Glasses of wine per week  ° Drug use: Never  ° ° °Family History  °Problem Relation Age of Onset  ° Cancer Mother   ° Varicose Veins Mother   ° Breast cancer Mother   °     60s  ° Cancer Father   °     leukemia  ° Colon polyps Father   ° Colon polyps Paternal Grandfather   ° Colon cancer Neg Hx   ° Esophageal cancer Neg Hx   ° Stomach cancer Neg Hx   ° Rectal cancer Neg Hx   ° ° °Allergies  °Allergen Reactions  ° Sulfonamide Derivatives   °  REACTION: rash  ° ° °OBJECTIVE: °Vitals:  ° 11/04/21 0844  °BP: 140/78  °Pulse: 72  °Temp: 98.2 °F (36.8 °C)  °TempSrc: Temporal  °SpO2: 96%  °Weight: 138 lb (62.6 kg)  ° °Body mass index is 25.24 kg/m². ° ° °  Physical Exam °General/constitutional: no distress, pleasant °HEENT: Normocephalic, PER, Conj Clear, EOMI, Oropharynx clear °Neck supple °CV: rrr no mrg °Lungs: clear to auscultation, normal respiratory effort °Abd: Soft, Nontender °Ext: no edema °Skin: No Rash °Neuro: nonfocal °MSK: no peripheral joint swelling/tenderness/warmth; back spines nontender ° ° ° °Lab: °Lab Results  °Component Value Date  ° WBC 5.3 10/22/2021  ° HGB 12.9 10/22/2021  ° HCT 38.0 10/22/2021  ° MCV 96.4 10/22/2021  ° PLT 367 10/22/2021  ° °Last metabolic panel °Lab Results  °Component Value Date  ° GLUCOSE 87 05/12/2010  ° NA 137  05/12/2010  ° K 3.6 05/12/2010  ° CL 107 05/12/2010  ° CO2 22 05/12/2010  ° BUN 9 05/12/2010  ° CREATININE 0.69 05/12/2010  ° GFRNONAA >60 05/12/2010  ° GFRAA  05/12/2010  °  >60        °The eGFR has been calculated °using the MDRD equation. °This calculation has not been °validated in all clinical °situations. °eGFR's persistently °<60 mL/min signify °possible Chronic Kidney Disease.  ° CALCIUM 8.9 05/12/2010  ° PROT 7.0 05/20/2021  ° ALBUMIN 4.4 05/20/2021  ° BILITOT 0.5 05/20/2021  ° ALKPHOS 64 05/20/2021  ° AST 16 05/20/2021  ° ALT 11 05/20/2021  ° ° °Serology: °09/2021 hep c rna negative °05/20/21 hep c ab positive; hcv rna 3.6k °05/20/21 hiv nonreactive ° °Imaging: ° ° °Assessment/plan: °Problem List Items Addressed This Visit   °None °Visit Diagnoses   ° ° Acute hepatitis C virus infection without hepatic coma    -  Primary  ° Relevant Orders  ° Hepatitis C antibody  ° Hepatitis C RNA quantitative  ° °  ° ° ° ° °#incidental hep c positive result °Reviewed labs -- Looks like she cleared her hep c, and had acute hep c °Likely true clearance/spontaneous clearance. Would repeat another test to document persistent of clearance  °Discussed some fluctuation possible although rare but preponderance suggest true clearance at around 5-6 months post acute infection ° ° °-hep c rna/antibody in 5 months °-phone visit 6 months to discuss result ° ° ° ° ° ° °Follow-up: Return in about 6 months (around 05/04/2022). ° ° °I have spent a total of 20 minutes of face-to-face and non-face-to-face time, excluding clinical staff time, preparing to see patient, ordering tests and/or medications, and provide counseling the patient ° ° ° °Trung T Vu, MD °[facility] °[facility] °[phone] pager   [phone] cell °11/04/2021, 8:57 AM ° °

## 2021-11-04 NOTE — Patient Instructions (Signed)
Your repeat testing indicates your body had cleared hep c infection. It appears this was an acute infection where 1 in 4 patients clears the virus  I am not entirely sure where you could have gotten this from   There is a phenomenom of fluctuating hep c viral load but this is rather low (negative) to account for this. Just to be sure, will repeat hep c testing in 02/2022; if this remains positive, then you would have cleared it  From now on (after the the repeat negative test), if testing is to be done do not test antibody, but only the viral load.

## 2021-12-24 ENCOUNTER — Encounter (HOSPITAL_COMMUNITY): Payer: Self-pay

## 2022-01-20 DIAGNOSIS — M545 Low back pain, unspecified: Secondary | ICD-10-CM | POA: Insufficient documentation

## 2022-04-05 IMAGING — US US LIVER ELASTOGRAPHY
2 series · 12 of 25 positions shown · non-contrast
Comparison: None.

CLINICAL DATA: Chronic hepatitis-C

EXAM:
US LIVER ELASTOGRAPHY
TECHNIQUE: Sonography of the liver was performed. In addition, ultrasound
elastography evaluation of the liver was performed. A region of
interest was placed within the right lobe of the liver. Following
application of a compressive sonographic pulse, tissue
compressibility was assessed. Multiple assessments were performed at
the selected site. Median tissue compressibility was determined.
Previously, hepatic stiffness was assessed by shear wave velocity.
Based on recently published Society of Radiologists in Ultrasound
consensus article, reporting is now recommended to be performed in
the SI units of pressure (kiloPascals) representing hepatic
stiffness/elasticity. The obtained result is compared to the
published reference standards. (cACLD = compensated Advanced Chronic
Liver Disease)

[Series 1: us elastography liver · 9 of 33 slices shown]
[im 2/33]
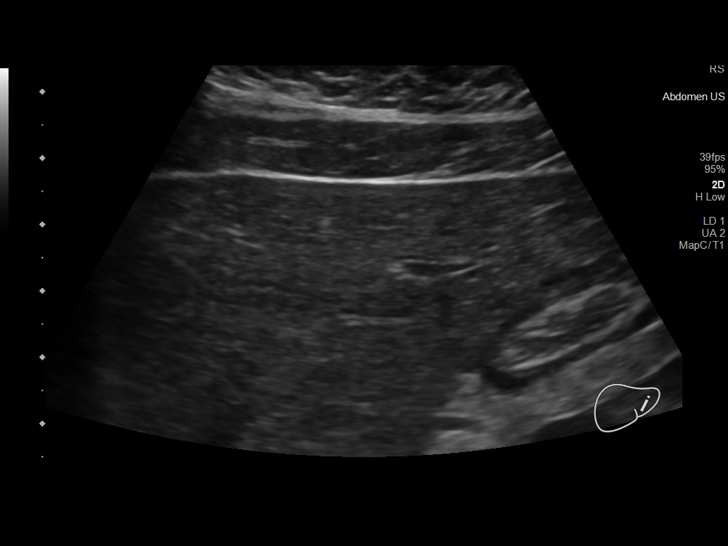
[im 6/33]
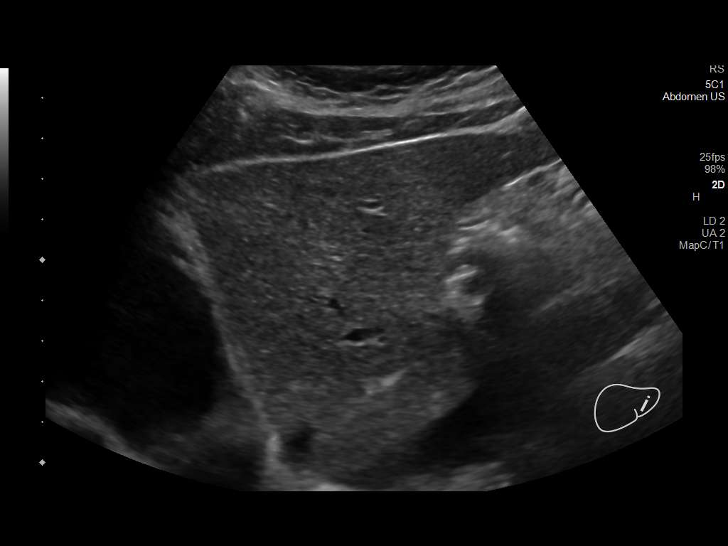
[im 10/33]
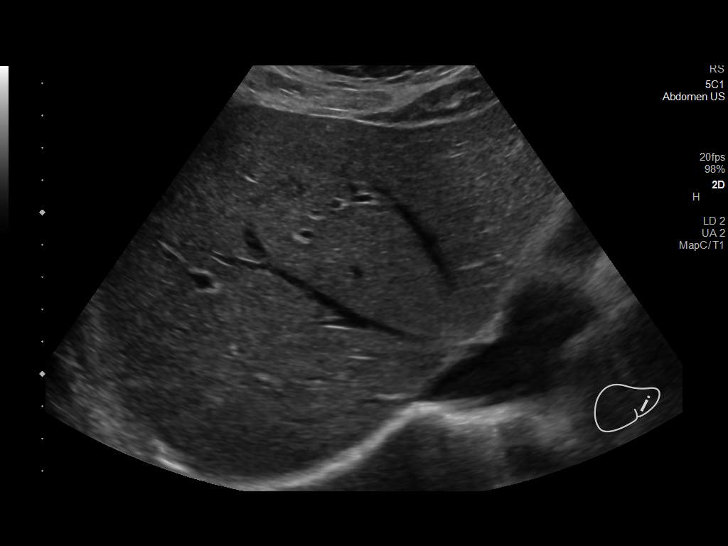
[im 14/33]
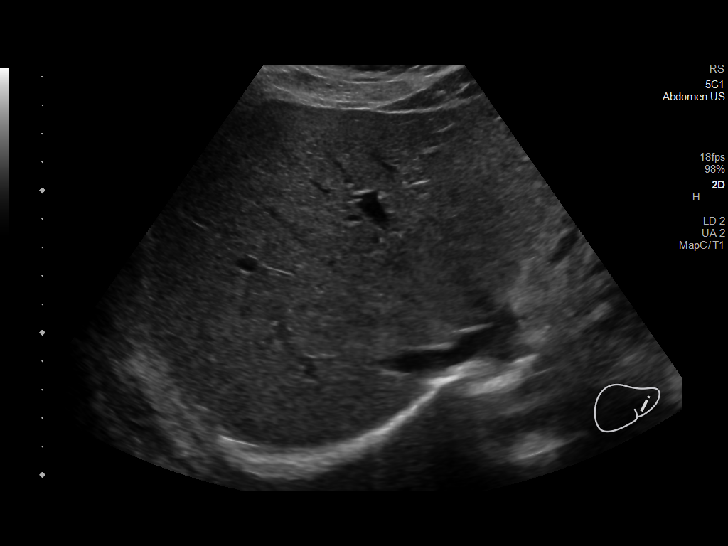
[im 17/33]
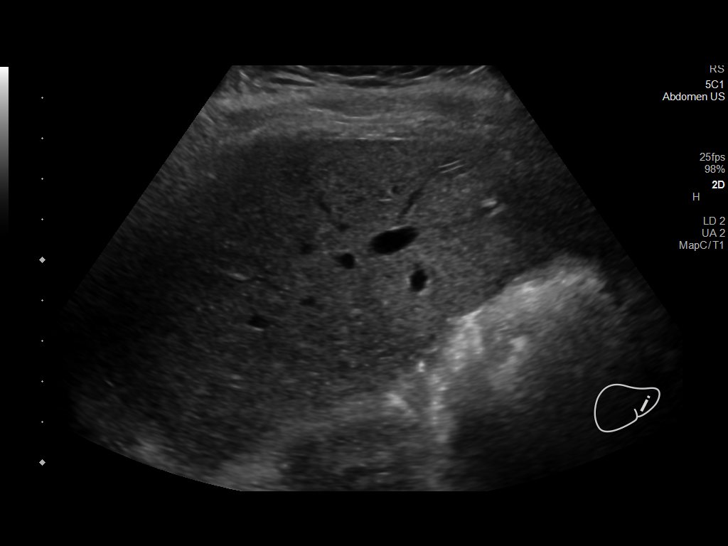
[im 21/33]
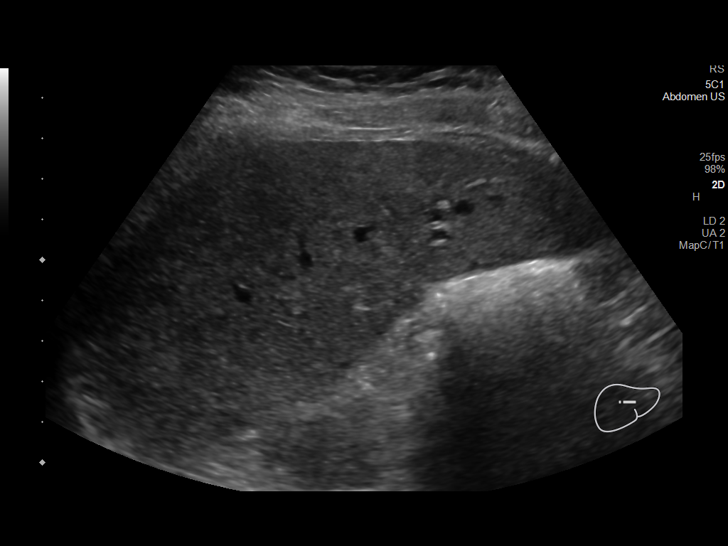
[im 25/33]
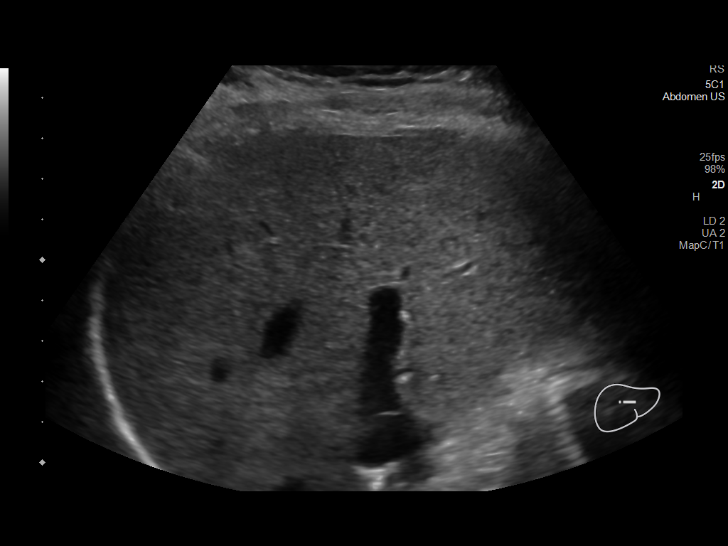
[im 29/33]
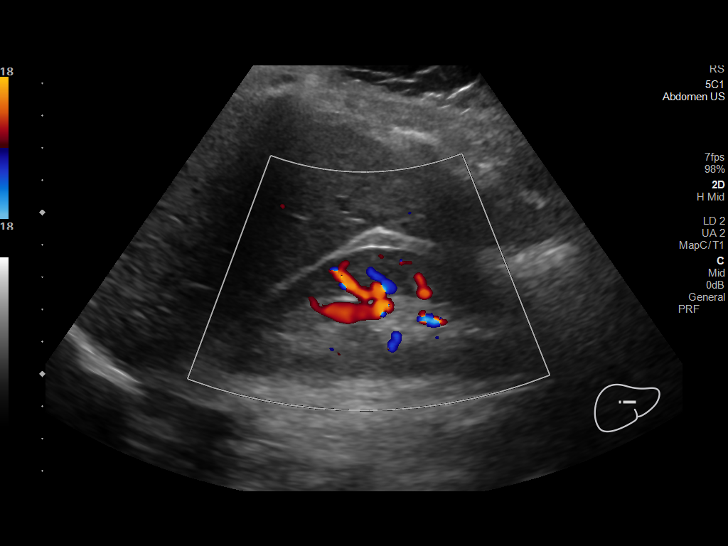
[im 33/33]
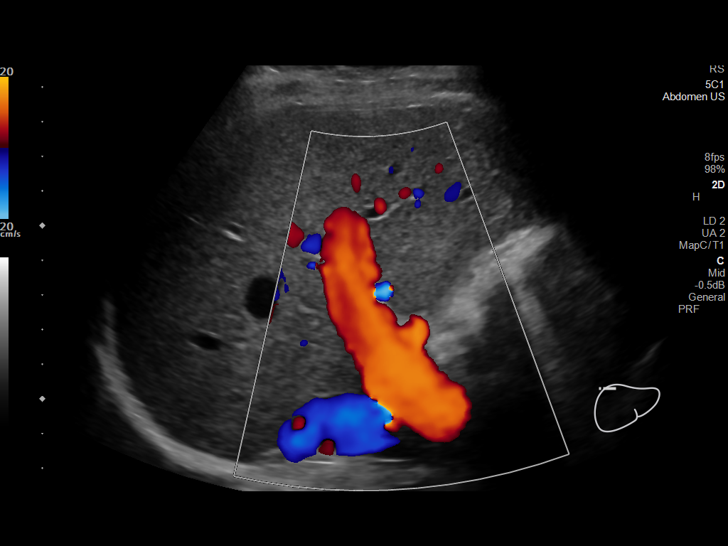

[Series 1001: abdomen us · 3 of 14 slices shown]
[im 3/14]
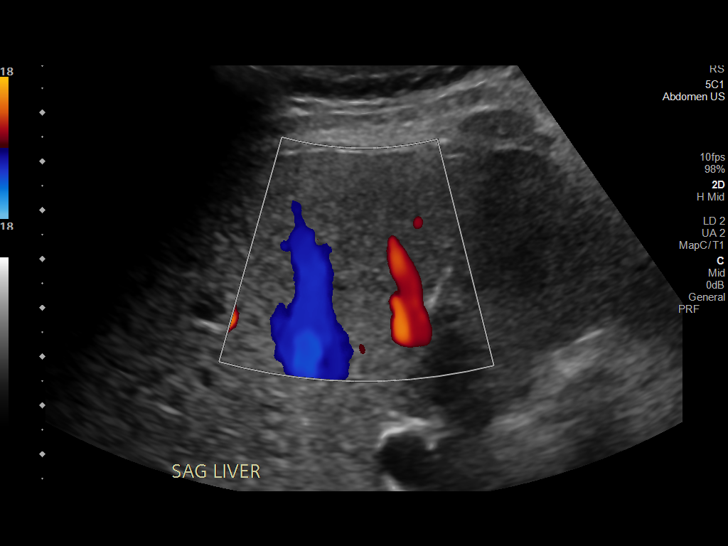
[im 7/14]
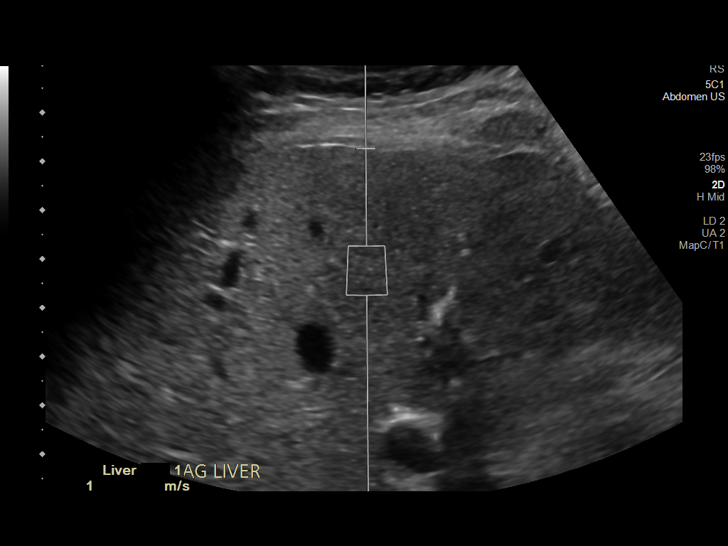
[im 11/14]
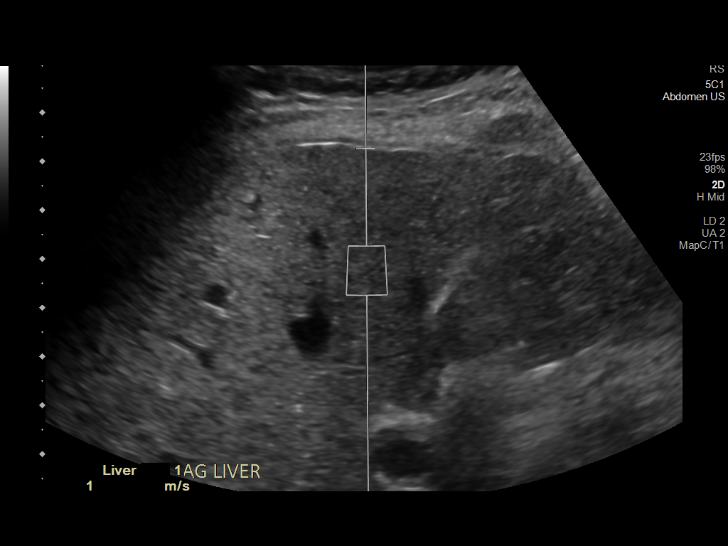

[12 of 25 positions shown; findings below may reference images not displayed]

FINDINGS: Liver: No focal lesion identified. Within normal limits in
parenchymal echogenicity. Portal vein is patent on color Doppler
imaging with normal direction of blood flow towards the liver.

ULTRASOUND HEPATIC ELASTOGRAPHY

Device: Siemens Helix VTQ

Patient position: Supine

Transducer: [id number]

Number of measurements: 10

Hepatic segment:  8

Median kPa:

IQR:

IQR/Median kPa ratio:

Data quality:  Good

Diagnostic category:  < or = 5 kPa: high probability of being normal

The use of hepatic elastography is applicable to patients with viral
hepatitis and non-alcoholic fatty liver disease. At this time, there
is insufficient data for the referenced cut-off values and use in
other causes of liver disease, including alcoholic liver disease.
Patients, however, may be assessed by elastography and serve as
their own reference standard/baseline.

In patients with non-alcoholic liver disease, the values suggesting
compensated advanced chronic liver disease (cACLD) may be lower, and
patients may need additional testing with elasticity results of [DATE]
kPa.

Please note that abnormal hepatic elasticity and shear wave
velocities may also be identified in clinical settings other than
with hepatic fibrosis, such as: acute hepatitis, elevated right
heart and central venous pressures including use of beta blockers,
Zapletal disease ([place]), infiltrative processes such as
mastocytosis/amyloidosis/infiltrative tumor/lymphoma, extrahepatic
cholestasis, with hyperemia in the post-prandial state, and with
liver transplantation. Correlation with patient history, laboratory
data, and clinical condition recommended.

Diagnostic Categories:

< or =5 kPa: high probability of being normal

< or =9 kPa: in the absence of other known clinical signs, rules [DATE] kPa and ?13 kPa: suggestive of cACLD, but needs further testing

>13 kPa: highly suggestive of cACLD

> or =17 kPa: highly suggestive of cACLD with an increased
probability of clinically significant portal hypertension
IMPRESSION: ULTRASOUND LIVER:

Unremarkable appearance.

ULTRASOUND HEPATIC ELASTOGRAPHY:

Median kPa:

Diagnostic category:  < or = 5 kPa: high probability of being normal

## 2022-04-14 ENCOUNTER — Other Ambulatory Visit: Payer: Self-pay

## 2022-04-14 ENCOUNTER — Other Ambulatory Visit: Payer: 59

## 2022-04-14 DIAGNOSIS — B1710 Acute hepatitis C without hepatic coma: Secondary | ICD-10-CM

## 2022-04-16 LAB — HEPATITIS C RNA QUANTITATIVE
HCV Quantitative Log: <1.18 log IU/mL
HCV RNA, PCR, QN: <15 IU/mL

## 2022-04-16 LAB — HEPATITIS C ANTIBODY: Hepatitis C Ab: NONREACTIVE

## 2022-04-22 ENCOUNTER — Encounter: Payer: Self-pay | Admitting: Obstetrics and Gynecology

## 2022-04-25 ENCOUNTER — Telehealth: Payer: Self-pay

## 2022-04-25 ENCOUNTER — Other Ambulatory Visit: Payer: Self-pay | Admitting: Obstetrics and Gynecology

## 2022-04-25 MED ORDER — ESTRADIOL 0.025 MG/24HR TD PTTW: 1.0000 | MEDICATED_PATCH | TRANSDERMAL | 0 refills | Status: DC

## 2022-04-25 NOTE — Telephone Encounter (Signed)
-----   Message from Jabier Mutton, MD sent at 04/25/2022  1:17 PM EDT ----- Please let patient know she had spontaneously cleared her hepatitis C   No need to repeat testing or follow up ID clinic. thanks

## 2022-04-25 NOTE — Telephone Encounter (Signed)
Called patient with lab results. Did not have any questions at this time. Will follow up as needed. Leatrice Jewels, RMA

## 2022-05-06 ENCOUNTER — Telehealth: Payer: 59 | Admitting: Internal Medicine

## 2022-05-19 NOTE — Progress Notes (Deleted)
56 y.o. [id number] Married White or Caucasian Not Hispanic or Latino female here for annual exam.      No LMP recorded. Patient has had a hysterectomy.          Sexually active: {yes no:314532}  The current method of family planning is {contraception:315051}.    Exercising: {yes no:314532}  {types:19826} Smoker:  {YES [id number]  Health Maintenance: [facility]:  [facility] History of abnormal Pap:  yes cervical cancer  MMG: 2022 had bx see epic  BMD:   none  Colonoscopy: 09/12/20 f/u 10 years  TDaP:  12/07/17  Gardasil: n/a   reports that she has never smoked. She has never used smokeless tobacco. She reports current alcohol use of about 6.0 standard drinks of alcohol per week. She reports that she does not use drugs.  Past Medical History:  Diagnosis Date   Cervical ca ([place])    PAC (premature atrial contraction)     Past Surgical History:  Procedure Laterality Date   ABDOMINAL HYSTERECTOMY     APPENDECTOMY     BREAST LUMPECTOMY WITH RADIOACTIVE SEED LOCALIZATION Left 09/12/2021   Procedure: LEFT BREAST LUMPECTOMY WITH RADIOACTIVE SEED LOCALIZATION;  Surgeon: Jovita Kussmaul, MD;  Location: [place];  Service: General;  Laterality: Left;   OOPHORECTOMY Left    SPIDER VEIN INJECTION      Current Outpatient Medications  Medication Sig Dispense Refill   Cholecalciferol (VITAMIN D) 400 UNITS capsule Take 400 Units by mouth daily.     estradiol (VIVELLE-DOT) 0.025 MG/24HR Place 1 patch onto the skin 2 (two) times a week. 8 patch 0   HYDROcodone-acetaminophen (NORCO/VICODIN) 5-325 MG tablet Take 1 tablet by mouth every 6 (six) hours as needed for moderate pain or severe pain. 15 tablet 0   Multiple Vitamin (MULTIVITAMIN) tablet Take 1 tablet by mouth daily.     Omega-3 Fatty Acids (FISH OIL) 1200 MG CAPS Take by mouth 3 (three) times daily.     Probiotic Product (PROBIOTIC-10 PO) Take by mouth.     No current facility-administered medications for  this visit.    Family History  Problem Relation Age of Onset   Cancer Mother    Varicose Veins Mother    Breast cancer Mother        44s   Cancer Father        leukemia   Colon polyps Father    Colon polyps Paternal Grandfather    Colon cancer Neg Hx    Esophageal cancer Neg Hx    Stomach cancer Neg Hx    Rectal cancer Neg Hx     Review of Systems  Exam:   There were no vitals taken for this visit.  Weight change: '@WEIGHTCHANGE'$ @ Height:      Ht Readings from Last 3 Encounters:  09/12/21 '5\' 2"'$  (1.575 m)  05/27/21 '5\' 2"'$  (1.575 m)  05/20/21 5' 1.5" (1.562 m)    General appearance: alert, cooperative and appears stated age Head: Normocephalic, without obvious abnormality, atraumatic Neck: no adenopathy, supple, symmetrical, trachea midline and thyroid {CHL AMB PHY EX THYROID NORM DEFAULT:[phone]::"normal to inspection and palpation"} Lungs: clear to auscultation bilaterally Cardiovascular: regular rate and rhythm Breasts: {Exam; breast:13139::"normal appearance, no masses or tenderness"} Abdomen: soft, non-tender; non distended,  no masses,  no organomegaly Extremities: extremities normal, atraumatic, no cyanosis or edema Skin: Skin color, texture, turgor normal. No rashes or lesions Lymph nodes: Cervical, supraclavicular, and axillary nodes normal. No abnormal inguinal nodes palpated  Neurologic: Grossly normal   Pelvic: External genitalia:  no lesions              Urethra:  normal appearing urethra with no masses, tenderness or lesions              Bartholins and Skenes: normal                 Vagina: normal appearing vagina with normal color and discharge, no lesions              Cervix: {CHL AMB PHY EX CERVIX NORM DEFAULT:[phone]::"no lesions"}               Bimanual Exam:  Uterus:  {CHL AMB PHY EX UTERUS NORM DEFAULT:[phone]::"normal size, contour, position, consistency, mobility, non-tender"}              Adnexa: {CHL AMB PHY EX ADNEXA NO MASS  [phone]::"no mass, fullness, tenderness"}               Rectovaginal: Confirms               Anus:  normal sphincter tone, no lesions  *** chaperoned for the exam.  A:  Well Woman with normal exam  P:

## 2022-05-28 ENCOUNTER — Ambulatory Visit: Payer: 59 | Admitting: Obstetrics and Gynecology

## 2022-06-02 ENCOUNTER — Encounter: Payer: Self-pay | Admitting: Obstetrics and Gynecology

## 2022-06-02 ENCOUNTER — Other Ambulatory Visit (HOSPITAL_COMMUNITY)
Admission: RE | Admit: 2022-06-02 | Discharge: 2022-06-02 | Disposition: A | Discharge status: Home / Self Care | Payer: 59 | Source: Ambulatory Visit | Attending: Obstetrics and Gynecology | Admitting: Obstetrics and Gynecology

## 2022-06-02 ENCOUNTER — Ambulatory Visit (INDEPENDENT_AMBULATORY_CARE_PROVIDER_SITE_OTHER): Payer: 59 | Admitting: Obstetrics and Gynecology

## 2022-06-02 VITALS — BP 126/74 | HR 76 | Ht 61.25 in | Wt 132.0 lb

## 2022-06-02 DIAGNOSIS — M8580 Other specified disorders of bone density and structure, unspecified site: Secondary | ICD-10-CM | POA: Insufficient documentation

## 2022-06-02 DIAGNOSIS — Z5181 Encounter for therapeutic drug level monitoring: Secondary | ICD-10-CM

## 2022-06-02 DIAGNOSIS — Z8541 Personal history of malignant neoplasm of cervix uteri: Secondary | ICD-10-CM | POA: Diagnosis not present

## 2022-06-02 DIAGNOSIS — Z1272 Encounter for screening for malignant neoplasm of vagina: Secondary | ICD-10-CM | POA: Insufficient documentation

## 2022-06-02 DIAGNOSIS — Z87442 Personal history of urinary calculi: Secondary | ICD-10-CM | POA: Insufficient documentation

## 2022-06-02 DIAGNOSIS — R002 Palpitations: Secondary | ICD-10-CM | POA: Insufficient documentation

## 2022-06-02 DIAGNOSIS — Z01419 Encounter for gynecological examination (general) (routine) without abnormal findings: Secondary | ICD-10-CM

## 2022-06-02 DIAGNOSIS — A6000 Herpesviral infection of urogenital system, unspecified: Secondary | ICD-10-CM | POA: Insufficient documentation

## 2022-06-02 DIAGNOSIS — Z90710 Acquired absence of both cervix and uterus: Secondary | ICD-10-CM | POA: Insufficient documentation

## 2022-06-02 DIAGNOSIS — Z79890 Hormone replacement therapy (postmenopausal): Secondary | ICD-10-CM

## 2022-06-02 MED ORDER — ESTRADIOL 0.025 MG/24HR TD PTTW: 1.0000 | MEDICATED_PATCH | TRANSDERMAL | 3 refills | Status: AC

## 2022-06-02 NOTE — Patient Instructions (Signed)

## 2022-06-02 NOTE — Progress Notes (Signed)
56 y.o. [id number] Married White or Caucasian Not Hispanic or Latino female here for annual exam.  She is on low dose transdermal estrogen, she loves it. Vasomotor symptoms are gone. Sexually active, no pain.    She has a h/o cervical cancer, s/p laparoscopic radical hysterectomy in ~2006. About a year later she had an ovarian torsion, ended up having laparotomy with left oophorectomy and her appendix (reached across her pelvis and was attached to her left ovary).  Mom had breast cancer in her 71's. Kim had a benign breast biopsy last fall.   Having issues with sciatica. Under control.   She Hep C and self healed, last testing was normal.   No LMP recorded. Patient has had a hysterectomy.          Sexually active: Yes.    The current method of family planning is status post hysterectomy.    Exercising: Yes.     Walking  Smoker:  no  Health Maintenance: [facility]:  [facility]. History of abnormal Pap:  yes cervical cancer  MMG: 2022 had bx see epic  BMD:   none  Colonoscopy: 09/12/20 f/u 10 years  TDaP:  12/07/17  Gardasil: n/a   reports that she has never smoked. She has never used smokeless tobacco. She reports current alcohol use of about 6.0 standard drinks of alcohol per week. She reports that she does not use drugs. 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 Her kids are 98 and 57. Oldest is a girl, independent and lives in [place]. Son just graduated from [employer]. Degree in music production.   Past Medical History:  Diagnosis Date   Cervical ca ([place])    PAC (premature atrial contraction)     Past Surgical History:  Procedure Laterality Date   ABDOMINAL HYSTERECTOMY     APPENDECTOMY     BREAST LUMPECTOMY WITH RADIOACTIVE SEED LOCALIZATION Left 09/12/2021   Procedure: LEFT BREAST LUMPECTOMY WITH RADIOACTIVE SEED LOCALIZATION;  Surgeon: Jovita Kussmaul, MD;  Location: [facility];  Service: General;  Laterality: Left;    OOPHORECTOMY Left    SPIDER VEIN INJECTION      Current Outpatient Medications  Medication Sig Dispense Refill   Cholecalciferol (VITAMIN D) 400 UNITS capsule Take 400 Units by mouth daily.     estradiol (VIVELLE-DOT) 0.025 MG/24HR Place 1 patch onto the skin 2 (two) times a week. 8 patch 0   Multiple Vitamin (MULTIVITAMIN) tablet Take 1 tablet by mouth daily.     Omega-3 Fatty Acids (FISH OIL) 1200 MG CAPS Take by mouth 3 (three) times daily.     Probiotic Product (PROBIOTIC-10 PO) Take by mouth.     No current facility-administered medications for this visit.    Family History  Problem Relation Age of Onset   Cancer Mother    Varicose Veins Mother    Breast cancer Mother        49s   Cancer Father        leukemia   Colon polyps Father    Colon polyps Paternal Grandfather    Colon cancer Neg Hx    Esophageal cancer Neg Hx    Stomach cancer Neg Hx    Rectal cancer Neg Hx     Review of Systems  All other systems reviewed and are negative.   Exam:   BP 126/74   Pulse 76   Ht 5' 1.25" (1.556 m)   Wt 132 lb (59.9 kg)  SpO2 99%   BMI 24.74 kg/m   Weight change: '@WEIGHTCHANGE'$ @ Height:   Height: 5' 1.25" (155.6 cm)  Ht Readings from Last 3 Encounters:  06/02/22 5' 1.25" (1.556 m)  09/12/21 '5\' 2"'$  (1.575 m)  05/27/21 '5\' 2"'$  (1.575 m)    General appearance: alert, cooperative and appears stated age Head: Normocephalic, without obvious abnormality, atraumatic Neck: no adenopathy, supple, symmetrical, trachea midline and thyroid normal to inspection and palpation Lungs: clear to auscultation bilaterally Cardiovascular: regular rate and rhythm Breasts: normal appearance, no masses or tenderness Abdomen: soft, non-tender; non distended,  no masses,  no organomegaly Extremities: extremities normal, atraumatic, no cyanosis or edema Skin: Skin color, texture, turgor normal. No rashes or lesions Lymph nodes: Cervical, supraclavicular, and axillary nodes normal. No abnormal  inguinal nodes palpated Neurologic: Grossly normal   Pelvic: External genitalia:  no lesions              Urethra:  normal appearing urethra with no masses, tenderness or lesions              Bartholins and Skenes: normal                 Vagina: normal appearing vagina with normal color and discharge, no lesions              Cervix: absent               Bimanual Exam:  Uterus:  uterus absent              Adnexa: no mass, fullness, tenderness               Rectovaginal: Confirms               Anus:  normal sphincter tone, no lesions  Gae Dry chaperoned for the exam.  1. Well woman exam Discussed breast self exam Discussed calcium and vit D intake Mammogram due in the fall Labs UTD with her primary  2. Encounter for monitoring postmenopausal estrogen replacement therapy Doing well, wants to continue. Aware of the risks - estradiol (VIVELLE-DOT) 0.025 MG/24HR; Place 1 patch onto the skin 2 (two) times a week.  Dispense: 24 patch; Refill: 3  3. History of cervical cancer - Cytology - PAP  4. Screening for vaginal cancer - Cytology - PAP

## 2022-06-05 LAB — CYTOLOGY - PAP
Comment: NEGATIVE
Diagnosis: NEGATIVE
High risk HPV: NEGATIVE

## 2022-07-18 ENCOUNTER — Other Ambulatory Visit: Payer: Self-pay | Admitting: Obstetrics and Gynecology

## 2022-07-18 DIAGNOSIS — Z1231 Encounter for screening mammogram for malignant neoplasm of breast: Secondary | ICD-10-CM

## 2022-07-22 ENCOUNTER — Ambulatory Visit: Payer: 59

## 2022-07-31 ENCOUNTER — Ambulatory Visit
Admission: RE | Admit: 2022-07-31 | Discharge: 2022-07-31 | Disposition: A | Discharge status: Home / Self Care | Payer: 59 | Source: Ambulatory Visit | Attending: Obstetrics and Gynecology | Admitting: Obstetrics and Gynecology

## 2022-07-31 DIAGNOSIS — Z1231 Encounter for screening mammogram for malignant neoplasm of breast: Secondary | ICD-10-CM

## 2022-08-01 ENCOUNTER — Other Ambulatory Visit: Payer: Self-pay | Admitting: Obstetrics and Gynecology

## 2022-08-01 DIAGNOSIS — R928 Other abnormal and inconclusive findings on diagnostic imaging of breast: Secondary | ICD-10-CM

## 2022-08-04 ENCOUNTER — Ambulatory Visit
Admission: RE | Admit: 2022-08-04 | Discharge: 2022-08-04 | Disposition: A | Discharge status: Home / Self Care | Payer: 59 | Source: Ambulatory Visit | Attending: Obstetrics and Gynecology | Admitting: Obstetrics and Gynecology

## 2022-08-04 DIAGNOSIS — R928 Other abnormal and inconclusive findings on diagnostic imaging of breast: Secondary | ICD-10-CM

## 2023-04-16 ENCOUNTER — Other Ambulatory Visit: Payer: Self-pay | Admitting: Obstetrics and Gynecology

## 2023-04-16 DIAGNOSIS — Z9189 Other specified personal risk factors, not elsewhere classified: Secondary | ICD-10-CM

## 2023-05-28 ENCOUNTER — Other Ambulatory Visit: Payer: 59

## 2023-06-04 ENCOUNTER — Inpatient Hospital Stay: Payer: Managed Care, Other (non HMO) | Attending: Hematology and Oncology | Admitting: Hematology and Oncology

## 2023-06-04 VITALS — BP 148/83 | HR 79 | Temp 97.5°F | Resp 18 | Ht 61.25 in | Wt 131.2 lb

## 2023-06-04 DIAGNOSIS — Z8541 Personal history of malignant neoplasm of cervix uteri: Secondary | ICD-10-CM | POA: Diagnosis not present

## 2023-06-04 DIAGNOSIS — Z90710 Acquired absence of both cervix and uterus: Secondary | ICD-10-CM | POA: Diagnosis not present

## 2023-06-04 DIAGNOSIS — Z9189 Other specified personal risk factors, not elsewhere classified: Secondary | ICD-10-CM | POA: Insufficient documentation

## 2023-06-04 DIAGNOSIS — Z90721 Acquired absence of ovaries, unilateral: Secondary | ICD-10-CM | POA: Diagnosis not present

## 2023-06-04 DIAGNOSIS — Z803 Family history of malignant neoplasm of breast: Secondary | ICD-10-CM | POA: Diagnosis not present

## 2023-06-04 NOTE — Progress Notes (Signed)
[facility] CONSULT NOTE  Patient Care Team: Etta Grandchild, MD as PCP - General (Internal Medicine) Romualdo Bolk, MD (Inactive) as Consulting Physician (Obstetrics and Gynecology)  CHIEF COMPLAINTS/PURPOSE OF CONSULTATION:  High risk for breast cancer  HISTORY OF PRESENTING ILLNESS:  Courtney [name] 57 y.o. female is here because of concern for being high risk for breast cancer.  Patient has previous history of cervical cancer and underwent hysterectomy.  She had a torsion of one of the ovaries and had to undergo oophorectomy.  Over the past several years she has been in menopause and has been experiencing severe menopausal symptoms.  Because of the symptoms she was prescribed an estrogen patch which remarkably improved her menopausal symptoms.  Because she has a family history of breast cancer Dr. Huntley Dec decided that it is better to discontinue estrogen patch and prescribed her Veozah.  Although Allyne Gee has helped her significantly it has not made her back to how she wants to be in terms of her overall feeling of wellbeing and emotional issues. She was referred to [place] to provide some context regarding her risk for breast cancer in the setting of estrogen replacement therapy.  Her biggest risk is her mother who had breast cancer.  I reviewed her records extensively and collaborated the history with the patient.   MEDICAL HISTORY:  Past Medical History:  Diagnosis Date   Cervical ca (HCC)    PAC (premature atrial contraction)     SURGICAL HISTORY: Past Surgical History:  Procedure Laterality Date   ABDOMINAL HYSTERECTOMY     APPENDECTOMY     BREAST BIOPSY Left 08/02/2021   BREAST LUMPECTOMY Left 09/12/2021   benign   BREAST LUMPECTOMY WITH RADIOACTIVE SEED LOCALIZATION Left 09/12/2021   Procedure: LEFT BREAST LUMPECTOMY WITH RADIOACTIVE SEED LOCALIZATION;  Surgeon: Griselda Miner, MD;  Location: [facility];  Service: General;  Laterality: Left;    OOPHORECTOMY Left    SPIDER VEIN INJECTION      SOCIAL HISTORY: Social History   Socioeconomic History   Marital status: Married    Spouse name: Not on file   Number of children: Not on file   Years of education: Not on file   Highest education level: Not on file  Occupational History   Not on file  Tobacco Use   Smoking status: Never   Smokeless tobacco: Never  Vaping Use   Vaping status: Never Used  Substance and Sexual Activity   Alcohol use: Yes    Alcohol/week: 6.0 standard drinks of alcohol    Types: 6 Glasses of wine per week   Drug use: Never   Sexual activity: Yes    Partners: Male    Birth control/protection: Surgical  Other Topics Concern   Not on file  Social History Narrative   Not on file   Social Determinants of Health   Financial Resource Strain: Not on file  Food Insecurity: Not on file  Transportation Needs: Not on file  Physical Activity: Not on file  Stress: Not on file  Social Connections: Unknown (03/10/2022)   Received from [facility]   Social Network    Social Network: Not on file  Intimate Partner Violence: Unknown (01/30/2022)   Received from [facility]   HITS    Physically Hurt: Not on file    Insult or Talk Down To: Not on file    Threaten Physical Harm: Not on file    Scream or Curse: Not on file  FAMILY HISTORY: Family History  Problem Relation Age of Onset   Cancer Mother    Varicose Veins Mother    Breast cancer Mother        19s   Cancer Father        leukemia   Colon polyps Father    Colon polyps Paternal Grandfather    Colon cancer Neg Hx    Esophageal cancer Neg Hx    Stomach cancer Neg Hx    Rectal cancer Neg Hx     ALLERGIES:  is allergic to sulfonamide derivatives.  MEDICATIONS:  Current Outpatient Medications  Medication Sig Dispense Refill   Cholecalciferol (VITAMIN D) 400 UNITS capsule Take 400 Units by mouth daily.     estradiol (VIVELLE-DOT) 0.025 MG/24HR Place 1 patch onto the skin 2 (two)  times a week. 24 patch 3   Multiple Vitamin (MULTIVITAMIN) tablet Take 1 tablet by mouth daily.     Omega-3 Fatty Acids (FISH OIL) 1200 MG CAPS Take by mouth 3 (three) times daily.     Probiotic Product (PROBIOTIC-10 PO) Take by mouth.     No current facility-administered medications for this visit.    REVIEW OF SYSTEMS:   Constitutional: Denies fevers, chills or abnormal night sweats Breast:  Denies any palpable lumps or discharge All other systems were reviewed with the patient and are negative.  PHYSICAL EXAMINATION: ECOG PERFORMANCE STATUS: 1 - Symptomatic but completely ambulatory  Vitals:   06/04/23 1543  BP: (!) 148/83  Pulse: 79  Resp: 18  Temp: (!) 97.5 F (36.4 C)  SpO2: 99%   Filed Weights   06/04/23 1543  Weight: 131 lb 3.2 oz (59.5 kg)    GENERAL:alert, no distress and comfortable    LABORATORY DATA:  I have reviewed the data as listed Lab Results  Component Value Date   WBC 5.3 10/22/2021   HGB 12.9 10/22/2021   HCT 38.0 10/22/2021   MCV 96.4 10/22/2021   PLT 367 10/22/2021   Lab Results  Component Value Date   NA 137 05/12/2010   K 3.6 05/12/2010   CL 107 05/12/2010   CO2 22 05/12/2010    RADIOGRAPHIC STUDIES: I have personally reviewed the radiological reports and agreed with the findings in the report.  ASSESSMENT AND PLAN:  At high risk for breast cancer 09/12/2021: Left lumpectomy: CSL with [facility]  Pathology counseling: Radial scars, also called complex sclerosing lesions, are a pathologic diagnosis, usually discovered incidentally when a breast mass or radiologic abnormality is removed or biopsied. Occasionally, radial scars are large enough to be detected on mammography as suspicious spiculated masses, which cannot be reliably differentiated from spiculated carcinoma by imaging alone. radial scars are excised because most case series show that 8 to 17 percent of surgical specimens at subsequent excision are positive for malignancy or  DCIS.  1.  Risk assessment:  Tyrer-Cuzick model: Lifetime risk of breast cancer with hormone replacement therapy: 20.5%  2. Risk reduction: Nonpharmacological risk reduction: Stress importance of eating healthy, diet, exercise, supplements like vitamin D and turmeric, avoiding alcohol: Patient is willing to try all of these aspects.  3.  Breast cancer surveillance: A.  Mammograms annually B.  Breast MRIs annually versus every 2 to 3 years: Based on greater than 20% lifetime risk of breast cancer and having dense breast tissue with a family history of breast cancer and requiring estrogen replacement therapy.  Estrogen replacement therapy: I provided the patient with risks of estrogen replacement therapy and increasing her breast  cancer risk.  After listening to all the risks, patient believes that the lifestyle benefits are worth the additional risk of breast cancer.  I however discussed with her to keep the duration of estrogen replacement therapy to the lowest amount required.  She uses 2 patches a week.  I discussed with her that she may cut down to 1 patch a week eventually before finally deciding to stop it.  We will plan for breast MRI to be done in the next 1 to 2 weeks.  I will call her with the result of this test. Subsequently we will plan for an MRI in April next year.  She gets her mammograms in October.  Return to clinic in 1 year for follow-up.  Subsequently she could be followed by Dr. Huntley Dec.  Return to clinic in 1 year for follow-up   All questions were answered. The patient knows to call the clinic with any problems, questions or concerns.    Tamsen Meek, MD 06/04/23

## 2023-06-04 NOTE — Assessment & Plan Note (Signed)
09/12/2021: Left lumpectomy: CSL with [facility]  Pathology counseling: Radial scars, also called complex sclerosing lesions, are a pathologic diagnosis, usually discovered incidentally when a breast mass or radiologic abnormality is removed or biopsied. Occasionally, radial scars are large enough to be detected on mammography as suspicious spiculated masses, which cannot be reliably differentiated from spiculated carcinoma by imaging alone. radial scars are excised because most case series show that 8 to 17 percent of surgical specimens at subsequent excision are positive for malignancy or DCIS.  1.  Risk assessment: ADondra Spry model: 5-year risk B.  Tyrer-Cuzick model: 10-year risk C.  Tyrer-Cuzick model: Lifetime risk  2. Risk reduction: A.  Pharmacological risk reduction: Tamoxifen versus raloxifene B.  Nonpharmacological risk reduction: Stress importance of eating healthy, diet, exercise, supplements like vitamin D and turmeric, avoiding alcohol  3.  Breast cancer surveillance: A.  Mammograms annually B.  Breast MRIs annually versus every 2 to 3 years  Return to clinic in 1 year for follow-up

## 2023-06-22 ENCOUNTER — Encounter: Payer: Self-pay | Admitting: Hematology and Oncology

## 2023-06-25 ENCOUNTER — Other Ambulatory Visit: Payer: Self-pay | Admitting: Internal Medicine

## 2023-06-25 DIAGNOSIS — Z1231 Encounter for screening mammogram for malignant neoplasm of breast: Secondary | ICD-10-CM

## 2023-06-26 ENCOUNTER — Encounter: Payer: Self-pay | Admitting: Hematology and Oncology

## 2023-07-05 ENCOUNTER — Ambulatory Visit
Admission: RE | Admit: 2023-07-05 | Discharge: 2023-07-05 | Disposition: A | Discharge status: Home / Self Care | Payer: Managed Care, Other (non HMO) | Source: Ambulatory Visit | Attending: Hematology and Oncology | Admitting: Hematology and Oncology

## 2023-07-05 DIAGNOSIS — Z9189 Other specified personal risk factors, not elsewhere classified: Secondary | ICD-10-CM

## 2023-07-05 MED ORDER — GADOPICLENOL 0.5 MMOL/ML IV SOLN
6.0000 mL | Freq: Once | INTRAVENOUS | Status: AC | PRN
  Administered 2023-07-05: 6 mL via INTRAVENOUS

## 2023-08-07 ENCOUNTER — Ambulatory Visit: Payer: Managed Care, Other (non HMO)

## 2023-08-11 ENCOUNTER — Ambulatory Visit
Admission: RE | Admit: 2023-08-11 | Discharge: 2023-08-11 | Disposition: A | Discharge status: Home / Self Care | Payer: Managed Care, Other (non HMO) | Source: Ambulatory Visit | Attending: Internal Medicine | Admitting: Internal Medicine

## 2023-08-11 DIAGNOSIS — Z1231 Encounter for screening mammogram for malignant neoplasm of breast: Secondary | ICD-10-CM

## 2024-01-04 ENCOUNTER — Encounter: Payer: Self-pay | Admitting: Internal Medicine

## 2024-01-04 ENCOUNTER — Ambulatory Visit: Payer: Managed Care, Other (non HMO) | Admitting: Internal Medicine

## 2024-01-04 VITALS — BP 144/88 | HR 78 | Temp 98.1°F | Resp 16 | Ht 61.25 in | Wt 130.2 lb

## 2024-01-04 DIAGNOSIS — I10 Essential (primary) hypertension: Secondary | ICD-10-CM | POA: Insufficient documentation

## 2024-01-04 DIAGNOSIS — Z0000 Encounter for general adult medical examination without abnormal findings: Secondary | ICD-10-CM

## 2024-01-04 DIAGNOSIS — Z13220 Encounter for screening for lipoid disorders: Secondary | ICD-10-CM

## 2024-01-04 DIAGNOSIS — Z0001 Encounter for general adult medical examination with abnormal findings: Secondary | ICD-10-CM | POA: Insufficient documentation

## 2024-01-04 DIAGNOSIS — I119 Hypertensive heart disease without heart failure: Secondary | ICD-10-CM | POA: Insufficient documentation

## 2024-01-04 LAB — CBC WITH DIFFERENTIAL/PLATELET
Basophils Absolute: 0 10*3/uL (ref 0.0–0.1)
Basophils Relative: 0.8 % (ref 0.0–3.0)
Eosinophils Absolute: 0.2 10*3/uL (ref 0.0–0.7)
Eosinophils Relative: 3.1 % (ref 0.0–5.0)
HCT: 39.7 % (ref 36.0–46.0)
Hemoglobin: 13.4 g/dL (ref 12.0–15.0)
Lymphocytes Relative: 42.9 % (ref 12.0–46.0)
Lymphs Abs: 2.2 10*3/uL (ref 0.7–4.0)
MCHC: 33.7 g/dL (ref 30.0–36.0)
MCV: 97.7 fl (ref 78.0–100.0)
Monocytes Absolute: 0.4 10*3/uL (ref 0.1–1.0)
Monocytes Relative: 7.8 % (ref 3.0–12.0)
Neutro Abs: 2.3 10*3/uL (ref 1.4–7.7)
Neutrophils Relative %: 45.4 % (ref 43.0–77.0)
Platelets: 384 10*3/uL (ref 150.0–400.0)
RBC: 4.07 Mil/uL (ref 3.87–5.11)
RDW: 13.1 % (ref 11.5–15.5)
WBC: 5.1 10*3/uL (ref 4.0–10.5)

## 2024-01-04 LAB — URINALYSIS, ROUTINE W REFLEX MICROSCOPIC
Bilirubin Urine: NEGATIVE
Hgb urine dipstick: NEGATIVE
Ketones, ur: NEGATIVE
Leukocytes,Ua: NEGATIVE
Nitrite: NEGATIVE
Specific Gravity, Urine: 1.02 (ref 1.000–1.030)
Total Protein, Urine: NEGATIVE
Urine Glucose: NEGATIVE
Urobilinogen, UA: 0.2 (ref 0.0–1.0)
pH: 6 (ref 5.0–8.0)

## 2024-01-04 LAB — LIPID PANEL
Cholesterol: 191 mg/dL (ref 0–200)
HDL: 87.8 mg/dL (ref >39.00)
LDL Cholesterol: 97 mg/dL (ref 0–99)
NonHDL: 103.18
Total CHOL/HDL Ratio: 2
Triglycerides: 31 mg/dL (ref 0.0–149.0)
VLDL: 6.2 mg/dL (ref 0.0–40.0)

## 2024-01-04 LAB — BASIC METABOLIC PANEL
BUN: 20 mg/dL (ref 6–23)
CO2: 30 meq/L (ref 19–32)
Calcium: 10.1 mg/dL (ref 8.4–10.5)
Chloride: 103 meq/L (ref 96–112)
Creatinine, Ser: 0.74 mg/dL (ref 0.40–1.20)
GFR: 90.82 mL/min (ref >60.00)
Glucose, Bld: 97 mg/dL (ref 70–99)
Potassium: 4.1 meq/L (ref 3.5–5.1)
Sodium: 141 meq/L (ref 135–145)

## 2024-01-04 LAB — HEPATIC FUNCTION PANEL
ALT: 13 U/L (ref 0–35)
AST: 16 U/L (ref 0–37)
Albumin: 4.8 g/dL (ref 3.5–5.2)
Alkaline Phosphatase: 64 U/L (ref 39–117)
Bilirubin, Direct: 0.1 mg/dL (ref 0.0–0.3)
Total Bilirubin: 0.3 mg/dL (ref 0.2–1.2)
Total Protein: 7.6 g/dL (ref 6.0–8.3)

## 2024-01-04 LAB — TSH: TSH: 1.63 u[IU]/mL (ref 0.35–5.50)

## 2024-01-04 MED ORDER — OLMESARTAN MEDOXOMIL 20 MG PO TABS: 20.0000 mg | ORAL_TABLET | Freq: Every day | ORAL | 1 refills | Status: DC

## 2024-01-04 NOTE — Patient Instructions (Signed)
 Hypertension, Adult High blood pressure (hypertension) is when the force of blood pumping through the arteries is too strong. The arteries are the blood vessels that carry blood from the heart throughout the body. Hypertension forces the heart to work harder to pump blood and may cause arteries to become narrow or stiff. Untreated or uncontrolled hypertension can lead to a heart attack, heart failure, a stroke, kidney disease, and other problems. A blood pressure reading consists of a higher number over a lower number. Ideally, your blood pressure should be below 120/80. The first ("top") number is called the systolic pressure. It is a measure of the pressure in your arteries as your heart beats. The second ("bottom") number is called the diastolic pressure. It is a measure of the pressure in your arteries as the heart relaxes. What are the causes? The exact cause of this condition is not known. There are some conditions that result in high blood pressure. What increases the risk? Certain factors may make you more likely to develop high blood pressure. Some of these risk factors are under your control, including: Smoking. Not getting enough exercise or physical activity. Being overweight. Having too much fat, sugar, calories, or salt (sodium) in your diet. Drinking too much alcohol. Other risk factors include: Having a personal history of heart disease, diabetes, high cholesterol, or kidney disease. Stress. Having a family history of high blood pressure and high cholesterol. Having obstructive sleep apnea. Age. The risk increases with age. What are the signs or symptoms? High blood pressure may not cause symptoms. Very high blood pressure (hypertensive crisis) may cause: Headache. Fast or irregular heartbeats (palpitations). Shortness of breath. Nosebleed. Nausea and vomiting. Vision changes. Severe chest pain, dizziness, and seizures. How is this diagnosed? This condition is diagnosed by  measuring your blood pressure while you are seated, with your arm resting on a flat surface, your legs uncrossed, and your feet flat on the floor. The cuff of the blood pressure monitor will be placed directly against the skin of your upper arm at the level of your heart. Blood pressure should be measured at least twice using the same arm. Certain conditions can cause a difference in blood pressure between your right and left arms. If you have a high blood pressure reading during one visit or you have normal blood pressure with other risk factors, you may be asked to: Return on a different day to have your blood pressure checked again. Monitor your blood pressure at home for 1 week or longer. If you are diagnosed with hypertension, you may have other blood or imaging tests to help your health care provider understand your overall risk for other conditions. How is this treated? This condition is treated by making healthy lifestyle changes, such as eating healthy foods, exercising more, and reducing your alcohol intake. You may be referred for counseling on a healthy diet and physical activity. Your health care provider may prescribe medicine if lifestyle changes are not enough to get your blood pressure under control and if: Your systolic blood pressure is above 130. Your diastolic blood pressure is above 80. Your personal target blood pressure may vary depending on your medical conditions, your age, and other factors. Follow these instructions at home: Eating and drinking  Eat a diet that is high in fiber and potassium, and low in sodium, added sugar, and fat. An example of this eating plan is called the DASH diet. DASH stands for Dietary Approaches to Stop Hypertension. To eat this way: Eat  plenty of fresh fruits and vegetables. Try to fill one half of your plate at each meal with fruits and vegetables. Eat whole grains, such as whole-wheat pasta, brown rice, or whole-grain bread. Fill about one  fourth of your plate with whole grains. Eat or drink low-fat dairy products, such as skim milk or low-fat yogurt. Avoid fatty cuts of meat, processed or cured meats, and poultry with skin. Fill about one fourth of your plate with lean proteins, such as fish, chicken without skin, beans, eggs, or tofu. Avoid pre-made and processed foods. These tend to be higher in sodium, added sugar, and fat. Reduce your daily sodium intake. Many people with hypertension should eat less than 1,500 mg of sodium a day. Do not drink alcohol if: Your health care provider tells you not to drink. You are pregnant, may be pregnant, or are planning to become pregnant. If you drink alcohol: Limit how much you have to: 0-1 drink a day for women. 0-2 drinks a day for men. Know how much alcohol is in your drink. In the [place] one drink equals one 12 oz bottle of beer (355 mL), one 5 oz glass of wine (148 mL), or one 1 oz glass of hard liquor (44 mL). Lifestyle  Work with your health care provider to maintain a healthy body weight or to lose weight. Ask what an ideal weight is for you. Get at least 30 minutes of exercise that causes your heart to beat faster (aerobic exercise) most days of the week. Activities may include walking, swimming, or biking. Include exercise to strengthen your muscles (resistance exercise), such as Pilates or lifting weights, as part of your weekly exercise routine. Try to do these types of exercises for 30 minutes at least 3 days a week. Do not use any products that contain nicotine or tobacco. These products include cigarettes, chewing tobacco, and vaping devices, such as e-cigarettes. If you need help quitting, ask your health care provider. Monitor your blood pressure at home as told by your health care provider. Keep all follow-up visits. This is important. Medicines Take over-the-counter and prescription medicines only as told by your health care provider. Follow directions carefully. Blood  pressure medicines must be taken as prescribed. Do not skip doses of blood pressure medicine. Doing this puts you at risk for problems and can make the medicine less effective. Ask your health care provider about side effects or reactions to medicines that you should watch for. Contact a health care provider if you: Think you are having a reaction to a medicine you are taking. Have headaches that keep coming back (recurring). Feel dizzy. Have swelling in your ankles. Have trouble with your vision. Get help right away if you: Develop a severe headache or confusion. Have unusual weakness or numbness. Feel faint. Have severe pain in your chest or abdomen. Vomit repeatedly. Have trouble breathing. These symptoms may be an emergency. Get help right away. Call 911. Do not wait to see if the symptoms will go away. Do not drive yourself to the hospital. Summary Hypertension is when the force of blood pumping through your arteries is too strong. If this condition is not controlled, it may put you at risk for serious complications. Your personal target blood pressure may vary depending on your medical conditions, your age, and other factors. For most people, a normal blood pressure is less than 120/80. Hypertension is treated with lifestyle changes, medicines, or a combination of both. Lifestyle changes include losing weight, eating a healthy,  low-sodium diet, exercising more, and limiting alcohol. This information is not intended to replace advice given to you by your health care provider. Make sure you discuss any questions you have with your health care provider. Document Revised: 08/20/2021 Document Reviewed: 08/20/2021 Elsevier Patient Education  2024 [employer].

## 2024-01-04 NOTE — Progress Notes (Unsigned)
 Subjective:  Patient ID: Courtney [name], female    DOB: [birth year]  Age: 58 y.o. MRN: [id number]  CC: Hypertension and Annual Exam   HPI Angla Delahunt presents for a CPX and f/up ----  Discussed the use of AI scribe software for clinical note transcription with the patient, who gave verbal consent to proceed.  History of Present Illness   The patient presents for an annual physical exam.  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 Her mammogram, colonoscopy, and pap smear are all up to date.  She has noticed her blood pressure has been higher than it used to be, with a reading of 148/83 in August. No headache, blurred vision, swelling in the legs or feet, chest pain, or shortness of breath. She recalls that her blood pressure used to be low enough to cause lightheadedness. There is a possible family history of hypertension from her father.  She has a history of menopause symptoms, which have improved since starting on a patch. She has been attending a gym for the past year, gaining muscle and feeling 'strong and powerful' during workouts. No chest pain or shortness of breath during physical activity.  She mentions a history of headaches but states they are better than she has ever been.       Outpatient Medications Prior to Visit  Medication Sig Dispense Refill   estradiol (VIVELLE-DOT) 0.025 MG/24HR Place 1 patch onto the skin 2 (two) times a week. 24 patch 3   Multiple Vitamin (MULTIVITAMIN) tablet Take 1 tablet by mouth daily.     Turmeric (QC TUMERIC COMPLEX PO) Take 500 mg by mouth daily in the afternoon.     VITAMIN D PO Take 2,000 Units by mouth daily.     Omega-3 Fatty Acids (FISH OIL) 1200 MG CAPS Take by mouth 3 (three) times daily.     Probiotic Product (PROBIOTIC-10 PO) Take by mouth.     No facility-administered medications prior to visit.    ROS Review of Systems   Constitutional: Negative.  Negative for chills, diaphoresis and fatigue.  HENT: Negative.    Eyes: Negative.   Respiratory:  Negative for cough, chest tightness, shortness of breath and wheezing.   Cardiovascular:  Negative for chest pain, palpitations and leg swelling.  Gastrointestinal:  Negative for abdominal pain, constipation, diarrhea, nausea and vomiting.  Endocrine: Negative.   Genitourinary: Negative.  Negative for difficulty urinating and hematuria.  Musculoskeletal: Negative.  Negative for arthralgias and myalgias.  Skin: Negative.   Neurological: Negative.  Negative for dizziness, weakness, light-headedness and headaches.  Hematological:  Negative for adenopathy. Does not bruise/bleed easily.  Psychiatric/Behavioral: Negative.      Objective:  BP (!) 144/88 (BP Location: Left Arm, Patient Position: Sitting, Cuff Size: Normal) Comment: BP (R) 142/86  Pulse 78   Temp 98.1 F (36.7 C) (Oral)   Resp 16   Ht 5' 1.25" (1.556 m)   Wt 130 lb 3.2 oz (59.1 kg)   SpO2 97%   BMI 24.40 kg/m   BP Readings from Last 3 Encounters:  01/04/24 (!) 144/88  06/04/23 (!) 148/83  06/02/22 126/74    Wt Readings from Last 3 Encounters:  01/04/24 130 lb 3.2 oz (59.1 kg)  06/04/23 131 lb 3.2 oz (59.5 kg)  06/02/22 132 lb (59.9 kg)    Physical Exam Vitals reviewed.  Constitutional:      Appearance: Normal appearance.  HENT:  Nose: Nose normal.     Mouth/Throat:     Mouth: Mucous membranes are moist.  Eyes:     General: No scleral icterus.    Conjunctiva/sclera: Conjunctivae normal.  Cardiovascular:     Rate and Rhythm: Normal rate and regular rhythm.     Heart sounds: Normal heart sounds, S1 normal and S2 normal. No murmur heard.    No friction rub. No gallop.     Comments: EKG---  NSR, 65 bpm Minimal LVH No Q waves or ST/T wave changes Pulmonary:     Effort: Pulmonary effort is normal.     Breath sounds: No stridor. No wheezing, rhonchi or rales.  Abdominal:      General: Abdomen is flat.     Palpations: There is no mass.     Tenderness: There is no abdominal tenderness. There is no guarding.     Hernia: No hernia is present.  Musculoskeletal:     Cervical back: Neck supple.     Right lower leg: No edema.     Left lower leg: No edema.  Lymphadenopathy:     Cervical: No cervical adenopathy.  Skin:    General: Skin is warm and dry.  Neurological:     General: No focal deficit present.     Mental Status: She is alert.  Psychiatric:        Mood and Affect: Mood normal.        Behavior: Behavior normal.     Lab Results  Component Value Date   WBC 5.1 01/04/2024   HGB 13.4 01/04/2024   HCT 39.7 01/04/2024   PLT 384.0 01/04/2024   GLUCOSE 97 01/04/2024   CHOL 191 01/04/2024   TRIG 31.0 01/04/2024   HDL 87.80 01/04/2024   LDLCALC 97 01/04/2024   ALT 13 01/04/2024   AST 16 01/04/2024   NA 141 01/04/2024   K 4.1 01/04/2024   CL 103 01/04/2024   CREATININE 0.74 01/04/2024   BUN 20 01/04/2024   CO2 30 01/04/2024   TSH 1.63 01/04/2024   INR 1.1 10/22/2021   HGBA1C 5.5 05/20/2021    MM 3D SCREENING MAMMOGRAM BILATERAL BREAST Result Date: 08/13/2023 CLINICAL DATA:  Screening. EXAM: DIGITAL SCREENING BILATERAL MAMMOGRAM WITH TOMOSYNTHESIS AND CAD TECHNIQUE: Bilateral screening digital craniocaudal and mediolateral oblique mammograms were obtained. Bilateral screening digital breast tomosynthesis was performed. The images were evaluated with computer-aided detection. COMPARISON:  Previous exam(s). ACR Breast Density Category c: The breasts are heterogeneously dense, which may obscure small masses. FINDINGS: There are no findings suspicious for malignancy. IMPRESSION: No mammographic evidence of malignancy. A result letter of this screening mammogram will be mailed directly to the patient. RECOMMENDATION: Screening mammogram in one year. (Code:SM-B-01Y) BI-RADS CATEGORY  1: Negative. Electronically Signed   By: Ted Mcalpine M.D.   On:  08/13/2023 13:07    Assessment & Plan:   Hypertension, unspecified type- Labs are negative for secondary causes. Will start an ARB -     EKG 12-Lead -     CBC with Differential/Platelet; Future -     Basic metabolic panel; Future -     TSH; Future -     Urinalysis, Routine w reflex microscopic; Future -     Hepatic function panel; Future -     Olmesartan Medoxomil; Take 1 tablet (20 mg total) by mouth daily.  Dispense: 90 tablet; Refill: 1  Encounter for general adult medical examination with abnormal findings- Exam completed, labs reviewed (statin is not indicated), vaccines reviewed, cancer  screenings are UTD,  pt ed material was given.  -     Lipid panel; Future  LVH (left ventricular hypertrophy) due to hypertensive disease, without heart failure -     Olmesartan Medoxomil; Take 1 tablet (20 mg total) by mouth daily.  Dispense: 90 tablet; Refill: 1     Follow-up: Return in about 6 months (around 07/06/2024).  Sanda Linger, MD

## 2024-01-05 ENCOUNTER — Encounter: Payer: Self-pay | Admitting: Internal Medicine

## 2024-01-08 ENCOUNTER — Other Ambulatory Visit: Payer: Self-pay | Admitting: Internal Medicine

## 2024-01-08 DIAGNOSIS — I119 Hypertensive heart disease without heart failure: Secondary | ICD-10-CM

## 2024-02-17 ENCOUNTER — Encounter: Payer: Self-pay | Admitting: Hematology and Oncology

## 2024-02-17 ENCOUNTER — Other Ambulatory Visit: Payer: Self-pay | Admitting: *Deleted

## 2024-02-17 DIAGNOSIS — Z9189 Other specified personal risk factors, not elsewhere classified: Secondary | ICD-10-CM

## 2024-02-17 NOTE — Progress Notes (Signed)
 Per MD pt needing breast MRI in April of 2025 due to pt being high risk.  Orders placed, GI notified to scheduled.

## 2024-02-29 ENCOUNTER — Encounter: Payer: Self-pay | Admitting: Hematology and Oncology

## 2024-03-01 ENCOUNTER — Other Ambulatory Visit: Payer: Self-pay | Admitting: *Deleted

## 2024-03-01 DIAGNOSIS — Z9189 Other specified personal risk factors, not elsewhere classified: Secondary | ICD-10-CM

## 2024-03-01 NOTE — Progress Notes (Signed)
 Received message from PA team stating breast MRI was denied by insurance and will only be covered once a year.  Verbal orders received and placed per MD to obtain breast MRI September of 2025 and mammogram March of 2026.

## 2024-03-02 ENCOUNTER — Other Ambulatory Visit

## 2024-05-26 ENCOUNTER — Encounter: Payer: Self-pay | Admitting: Internal Medicine

## 2024-05-26 ENCOUNTER — Ambulatory Visit: Attending: Internal Medicine | Admitting: Internal Medicine

## 2024-05-26 VITALS — BP 120/74 | HR 74 | Ht 61.25 in | Wt 135.2 lb

## 2024-05-26 DIAGNOSIS — Z7189 Other specified counseling: Secondary | ICD-10-CM

## 2024-05-26 DIAGNOSIS — R9431 Abnormal electrocardiogram [ECG] [EKG]: Secondary | ICD-10-CM | POA: Diagnosis not present

## 2024-05-26 DIAGNOSIS — R002 Palpitations: Secondary | ICD-10-CM

## 2024-05-26 DIAGNOSIS — I498 Other specified cardiac arrhythmias: Secondary | ICD-10-CM | POA: Diagnosis not present

## 2024-05-26 DIAGNOSIS — I119 Hypertensive heart disease without heart failure: Secondary | ICD-10-CM

## 2024-05-26 NOTE — Patient Instructions (Signed)
 Medication Instructions:  No Changes *If you need a refill on your cardiac medications before your next appointment, please call your pharmacy*  Lab Work: None  Testing/Procedures: Your physician has requested that you have an echocardiogram. Echocardiography is a painless test that uses sound waves to create images of your heart. It provides your doctor with information about the size and shape of your heart and how well your heart's chambers and valves are working. This procedure takes approximately one hour. There are no restrictions for this procedure. Please do NOT wear cologne, perfume, aftershave, or lotions (deodorant is allowed). Please arrive 15 minutes prior to your appointment time.  Please note: We ask at that you not bring children with you during ultrasound (echo/ vascular) testing. Due to room size and safety concerns, children are not allowed in the ultrasound rooms during exams. Our front office staff cannot provide observation of children in our lobby area while testing is being conducted. An adult accompanying a patient to their appointment will only be allowed in the ultrasound room at the discretion of the ultrasound technician under special circumstances. We apologize for any inconvenience.   Dr. Soyla Merck has ordered a CT coronary calcium score.   Test locations:  [facility] at [facility] [place]  [facility] at [facility]  This is $99 out of pocket.   Coronary CalciumScan A coronary calcium scan is an imaging test used to look for deposits of calcium and other fatty materials (plaques) in the inner lining of the blood vessels of the heart (coronary arteries). These deposits of calcium and plaques can partly clog and narrow the coronary arteries without producing any symptoms or warning signs. This puts a person at risk for a heart attack. This test can detect  these deposits before symptoms develop. Tell a health care provider about: Any allergies you have. All medicines you are taking, including vitamins, herbs, eye drops, creams, and over-the-counter medicines. Any problems you or family members have had with anesthetic medicines. Any blood disorders you have. Any surgeries you have had. Any medical conditions you have. Whether you are pregnant or may be pregnant. What are the risks? Generally, this is a safe procedure. However, problems may occur, including: Harm to a pregnant woman and her unborn baby. This test involves the use of radiation. Radiation exposure can be dangerous to a pregnant woman and her unborn baby. If you are pregnant, you generally should not have this procedure done. Slight increase in the risk of cancer. This is because of the radiation involved in the test. What happens before the procedure? No preparation is needed for this procedure. What happens during the procedure? You will undress and remove any jewelry around your neck or chest. You will put on a hospital gown. Sticky electrodes will be placed on your chest. The electrodes will be connected to an electrocardiogram (ECG) machine to record a tracing of the electrical activity of your heart. A CT scanner will take pictures of your heart. During this time, you will be asked to lie still and hold your breath for 2-3 seconds while a picture of your heart is being taken. The procedure may vary among health care providers and hospitals. What happens after the procedure? You can get dressed. You can return to your normal activities. It is up to you to get the results of your test. Ask your health care provider, or the department that is doing the  test, when your results will be ready. Summary A coronary calcium scan is an imaging test used to look for deposits of calcium and other fatty materials (plaques) in the inner lining of the blood vessels of the heart (coronary  arteries). Generally, this is a safe procedure. Tell your health care provider if you are pregnant or may be pregnant. No preparation is needed for this procedure. A CT scanner will take pictures of your heart. You can return to your normal activities after the scan is done. This information is not intended to replace advice given to you by your health care provider. Make sure you discuss any questions you have with your health care provider. Document Released: 04/10/2008 Document Revised: 09/01/2016 Document Reviewed: 09/01/2016 Elsevier Interactive Patient Education  2017 [employer].   Follow-Up: At [facility], you and your health needs are our priority.  As part of our continuing mission to provide you with exceptional heart care, our providers are all part of one team.  This team includes your primary Cardiologist (physician) and Advanced Practice Providers or APPs (Physician Assistants and Nurse Practitioners) who all work together to provide you with the care you need, when you need it.  Your next appointment:   6 week(s)  Provider:   Jon Hails, PA-C, Lum Louis, NP, Aline Door, PA-C, Kathleen Johnson, PA-C, Hao Meng, PA-C, or Damien Braver, NP      Then, Gayatri A Acharya, MD will plan to see you again in 6 month(s).   Other Instructions Please call us  or send a MyChart message with any Cardiology related questions/concerns.  [phone].  Thank you!

## 2024-05-26 NOTE — Progress Notes (Signed)
 Cardiology Office Note:  .   Date:  05/26/2024  ID:  Courtney [name], DOB 22-Dec-1965, MRN [id number] PCP: Joshua Debby CROME, MD  [facility] Providers Cardiologist:  Soyla DELENA Merck, MD    History of Present Illness: Courtney [name] is a 58 y.o. female.  Discussed the use of AI scribe software for clinical note transcription with the patient, who gave verbal consent to proceed.  History of Present Illness Courtney [name] is a 58 year old female who presents with an abnormal EKG and elevated blood pressure readings. She was referred by Dr. Joshua for evaluation of an abnormal EKG with mild LVH and elevated blood pressure readings.  She has a history of irregular heartbeat, described as a 'regular irregularity,' present since college. No recent palpitations since eliminating caffeine. No chest pain or shortness of breath.  Previously prescribed Benicar  for hypertension but did not take. Home blood pressure readings are in the 110s/70s range. No dizziness or fainting. Two instances of elevated blood pressure in medical settings, possibly due to stress or caffeine.  Her lifestyle includes a healthy diet and exercise, with no caffeine consumption. She works as a Public relations account executive at [employer].    ROS: negative except per HPI above.  Studies Reviewed: .        Results LABS TSH: Normal HDL: 87 LDL: 97 Triglycerides: 31  DIAGNOSTIC EKG: Minimal criteria for left ventricular hypertrophy Risk Assessment/Calculations:       Physical Exam:   VS:  BP 120/74 (BP Location: Right Arm, Patient Position: Sitting, Cuff Size: Normal)   Pulse 74   Ht 5' 1.25 (1.556 m)   Wt 135 lb 3.2 oz (61.3 kg)   SpO2 99%   BMI 25.34 kg/m    Wt Readings from Last 3 Encounters:  05/26/24 135 lb 3.2 oz (61.3 kg)  01/04/24 130 lb 3.2 oz (59.1 kg)  06/04/23 131 lb 3.2 oz (59.5 kg)     Physical Exam VITALS: BP- 120/74 GENERAL: Alert, cooperative,  well developed, no acute distress HEENT: Normocephalic, normal oropharynx, moist mucous membranes CHEST: Clear to auscultation bilaterally, no wheezes, rhonchi, or crackles CARDIOVASCULAR: Normal heart rate and rhythm, S1 and S2 normal without murmurs. Notable sinus arrhythmia ABDOMEN: Soft, non-tender, non-distended, without organomegaly, normal bowel sounds EXTREMITIES: No cyanosis or edema NEUROLOGICAL: Cranial nerves grossly intact, moves all extremities without gross motor or sensory deficit   ASSESSMENT AND PLAN: .    Assessment and Plan Assessment & Plan Abnormal EKG with minimal criteria for left ventricular hypertrophy EKG indicated minimal criteria for left ventricular hypertrophy, possibly due to hypertension though home BP readings normal. Echocardiogram needed for confirmation. - Order echocardiogram to assess for left ventricular hypertrophy. - If echocardiogram confirms thickened heart muscle, monitor blood pressure daily.  Observation for hypertension Previous elevated blood pressure readings may be situational. Current readings suggest possible white coat hypertension or transient elevations. - OK to pause olmesartan  (Benicar ). - Monitor blood pressure at home regularly.  History of episodic palpitations and sinus arrhythmia Episodic palpitations possibly linked to caffeine. EKG shows normal rhythm. Sinus arrhythmia noted on exam, is benign. - Observe for recurrence of palpitations. - Check EKG during follow-up visits to monitor heart rhythm.  Cardiac risk counseling LDL cholesterol slightly above target. HDL and triglycerides are favorable. Current lifestyle is good; further dietary changes may not significantly impact LDL. - Order coronary calcium scoring CT to assess cardiovascular risk. - Repeat fasting lipid panel in a  few months. - Focus on maintaining a healthy diet to manage LDL levels.      Soyla Merck, MD, FACC

## 2024-06-01 NOTE — Assessment & Plan Note (Signed)
 09/12/2021: Left lumpectomy: CSL with [facility]   Pathology counseling: Radial scars, also called complex sclerosing lesions, are a pathologic diagnosis, usually discovered incidentally when a breast mass or radiologic abnormality is removed or biopsied. Occasionally, radial scars are large enough to be detected on mammography as suspicious spiculated masses, which cannot be reliably differentiated from spiculated carcinoma by imaging alone. radial scars are excised because most case series show that 8 to 17 percent of surgical specimens at subsequent excision are positive for malignancy or DCIS.   1.  Risk assessment:  Tyrer-Cuzick model: Lifetime risk of breast cancer with hormone replacement therapy: 20.5%   2. Risk reduction: Nonpharmacological risk reduction: Stress importance of eating healthy, diet, exercise, supplements like vitamin D and turmeric, avoiding alcohol: Patient is willing to try all of these aspects.   3.  Breast cancer surveillance: A.  Mammograms annually B.  Breast MRIs  07/06/23: Benign C. Mammogram 08/12/24: Benign

## 2024-06-02 ENCOUNTER — Inpatient Hospital Stay: Payer: Managed Care, Other (non HMO) | Attending: Hematology and Oncology | Admitting: Hematology and Oncology

## 2024-06-02 VITALS — BP 136/65 | HR 67 | Temp 98.0°F | Resp 17 | Ht 61.25 in | Wt 135.0 lb

## 2024-06-02 DIAGNOSIS — Z9189 Other specified personal risk factors, not elsewhere classified: Secondary | ICD-10-CM | POA: Insufficient documentation

## 2024-06-02 DIAGNOSIS — N6092 Unspecified benign mammary dysplasia of left breast: Secondary | ICD-10-CM | POA: Insufficient documentation

## 2024-06-02 NOTE — Progress Notes (Signed)
 Patient Care Team: Joshua Debby CROME, MD as PCP - General (Internal Medicine) Loni Soyla LABOR, MD as PCP - Cardiology (Cardiology) Jannis Kate Norris, MD as Consulting Physician (Obstetrics and Gynecology)  DIAGNOSIS:  Encounter Diagnosis  Name Primary?   At high risk for breast cancer Yes      CHIEF COMPLIANT:   HISTORY OF PRESENT ILLNESS:   History of Present Illness Courtney [name] is a 58 year old female with atypical ductal hyperplasia who presents for a discussion on updated mammogram options.  She has been undergoing routine imaging, including mammograms and MRIs, to monitor atypical ductal hyperplasia. Her last MRI was in September of the previous year, followed by a mammogram in October. She is considering transitioning to contrast-enhanced mammograms, which involve an hour-long appointment with IV contrast and are available at her usual breast center.     ALLERGIES:  is allergic to sulfonamide derivatives.  MEDICATIONS:  Current Outpatient Medications  Medication Sig Dispense Refill   cetirizine (ZYRTEC) 10 MG chewable tablet Chew 10 mg by mouth as needed for allergies.     estradiol  (VIVELLE -DOT) 0.025 MG/24HR Place 1 patch onto the skin 2 (two) times a week. 24 patch 3   Multiple Vitamin (MULTIVITAMIN) tablet Take 1 tablet by mouth daily.     Omega-3 Fatty Acids (OMEGA 3 PO) Take 1,200 mg by mouth daily.     VITAMIN D PO Take 2,000 Units by mouth daily.     olmesartan  (BENICAR ) 20 MG tablet Take 1 tablet (20 mg total) by mouth daily. (Patient not taking: Reported on 06/02/2024) 90 tablet 1   Turmeric (QC TUMERIC COMPLEX PO) Take 500 mg by mouth daily in the afternoon. (Patient not taking: Reported on 06/02/2024)     No current facility-administered medications for this visit.    PHYSICAL EXAMINATION: ECOG PERFORMANCE STATUS: 1 - Symptomatic but completely ambulatory  Vitals:   06/02/24 1405  BP: 136/65  Pulse: 67  Resp: 17  Temp: 98 F (36.7 C)   SpO2: 100%   Filed Weights   06/02/24 1405  Weight: 135 lb (61.2 kg)    Physical Exam   (exam performed in the presence of a chaperone)  LABORATORY DATA:  I have reviewed the data as listed    Latest Ref Rng & Units 01/04/2024    2:23 PM 05/20/2021    1:46 PM 05/12/2010    6:52 PM  CMP  Glucose 70 - 99 mg/dL 97   87   BUN 6 - 23 mg/dL 20   9   Creatinine 9.59 - 1.20 mg/dL 9.25   9.30   Sodium 864 - 145 mEq/L 141   137   Potassium 3.5 - 5.1 mEq/L 4.1   3.6   Chloride 96 - 112 mEq/L 103   107   CO2 19 - 32 mEq/L 30   22   Calcium 8.4 - 10.5 mg/dL 89.8   8.9   Total Protein 6.0 - 8.3 g/dL 7.6  7.0  7.3   Total Bilirubin 0.2 - 1.2 mg/dL 0.3  0.5  0.7   Alkaline Phos 39 - 117 U/L 64  64  47   AST 0 - 37 U/L 16  16  19    ALT 0 - 35 U/L 13  11  12      Lab Results  Component Value Date   WBC 5.1 01/04/2024   HGB 13.4 01/04/2024   HCT 39.7 01/04/2024   MCV 97.7 01/04/2024   PLT 384.0  01/04/2024   NEUTROABS 2.3 01/04/2024    ASSESSMENT & PLAN:  At high risk for breast cancer 09/12/2021: Left lumpectomy: CSL with [facility]   Pathology counseling: Radial scars, also called complex sclerosing lesions, are a pathologic diagnosis, usually discovered incidentally when a breast mass or radiologic abnormality is removed or biopsied. Occasionally, radial scars are large enough to be detected on mammography as suspicious spiculated masses, which cannot be reliably differentiated from spiculated carcinoma by imaging alone. radial scars are excised because most case series show that 8 to 17 percent of surgical specimens at subsequent excision are positive for malignancy or DCIS.   1.  Risk assessment:  Tyrer-Cuzick model: Lifetime risk of breast cancer with hormone replacement therapy: 20.5%   2. Risk reduction: Nonpharmacological risk reduction: Stress importance of eating healthy, diet, exercise, supplements like vitamin D and turmeric, avoiding alcohol: Patient is willing to try all of  these aspects.   3.  Breast cancer surveillance: A.  Mammograms 08/13/2023: Benign breast density category C B.  Breast MRIs  07/06/23: Benign   Recommend Contrast Enhanced Mammogram in October 2025 instead of breast MRI Assessment & Plan High risk for breast cancer due to history of atypical ductal hyperplasia She is at high risk for breast cancer due to ADH with no genetic mutations. Risk level approximately 20%. Contrast-enhanced mammograms may replace MRIs. - Schedule contrast-enhanced mammogram for October 20th, 2025. - Discontinue alternating MRI and mammogram screenings. - Continue current supplements including vitamin D and turmeric.  Return to clinic in 1 year for follow-up    No orders of the defined types were placed in this encounter.  The patient has a good understanding of the overall plan. she agrees with it. she will call with any problems that may develop before the next visit here. Total time spent: 30 mins including face to face time and time spent for planning, charting and co-ordination of care   Viinay K Yecenia Dalgleish, MD 06/02/24

## 2024-06-13 ENCOUNTER — Ambulatory Visit (HOSPITAL_COMMUNITY)
Admission: RE | Admit: 2024-06-13 | Discharge: 2024-06-13 | Disposition: A | Discharge status: Home / Self Care | Source: Ambulatory Visit | Attending: Internal Medicine | Admitting: Internal Medicine

## 2024-06-13 ENCOUNTER — Ambulatory Visit: Payer: Self-pay | Admitting: Internal Medicine

## 2024-06-13 DIAGNOSIS — R9431 Abnormal electrocardiogram [ECG] [EKG]: Secondary | ICD-10-CM | POA: Diagnosis present

## 2024-06-13 LAB — ECHOCARDIOGRAM COMPLETE
AR max vel: 2.63 cm2
AV Area VTI: 2.36 cm2
AV Area mean vel: 2.38 cm2
AV Mean grad: 3 mmHg
AV Peak grad: 4.8 mmHg
Ao pk vel: 1.09 m/s
Area-P 1/2: 3.68 cm2
S' Lateral: 3.4 cm

## 2024-07-03 NOTE — Progress Notes (Signed)
 Cardiology Office Note   Date:  07/15/2024  ID:  Suzen Fritzi Kerns, DOB 1966/09/16, MRN [id number] PCP: Joshua Debby CROME, MD  [facility] Providers Cardiologist:  Soyla DELENA Merck, MD   History of Present Illness Eyvonne Burchfield is a 58 y.o. female who recently established care with Dr. Merck for evaluation of elevated BP readings and abnormal EKG. Presents today for a follow up appointment   Patient was referred to Dr. Merck and seen on 7/31 for evaluation of elevated BP readings and abnormal EKG. Patient's elevated BP readings appeared to be situational and it was suspected she had white coat HTN. Benicar  was stopped and she was instructed to monitor BP at home. She underwent echocardiogram on 06/13/24 that showed EF 60-65%, no regional wall motion abnormalities, normal diastolic parameters, normal RV systolic function, mild MR. Underwent CT calcium scoring that showed a coronary calcium score of 0.   Patient presents today for a follow up appointment. She has been feeling well since last being seen. We reviewed the results of her echocardiogram and CT calcium scoring. BP has been well controlled. No chest pain, shortness of breath, dizziness, syncope, near syncope, palpitations. She is interested in getting her cholesterol rechecked, last time it was checked was in 12/2023. 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 Has developed an upper respiratory infection with nasal congestion, runny eyes.    Studies Reviewed Cardiac Studies & Procedures   ______________________________________________________________________________________________     ECHOCARDIOGRAM  ECHOCARDIOGRAM COMPLETE 06/13/2024  Narrative ECHOCARDIOGRAM REPORT    Patient Name:   RYANNE MORAND Capri Date of Exam: 06/13/2024 Medical Rec #:  [id number]           Height:       61.3 in Accession #:    [id number]          Weight:       135.0 lb Date of Birth:   06/23/1966           BSA:          1.603 m Patient Age:    58 years            BP:           147/95 mmHg Patient Gender: F                   HR:           71 bpm. Exam Location:  Outpatient  Procedure: 2D Echo, Cardiac Doppler and Color Doppler (Both Spectral and Color Flow Doppler were utilized during procedure).  Indications:    Abnormal Electrocardiogram R94.31  History:        Patient has no prior history of Echocardiogram examinations. Arrythmias:PAC; Risk Factors:Hypertension.  Sonographer:    Sharlet Hamilton RDCS Referring Phys: [id number] SOYLA A ACHARYA  IMPRESSIONS   1. Left ventricular ejection fraction, by estimation, is 60 to 65%. The left ventricle has normal function. The left ventricle has no regional wall motion abnormalities. Left ventricular diastolic parameters were normal. 2. Right ventricular systolic function is normal. The right ventricular size is normal. There is normal pulmonary artery systolic pressure. The estimated right ventricular systolic pressure is 18.1 mmHg. 3. The mitral valve is normal in structure. Mild mitral valve regurgitation. No evidence of mitral stenosis. 4. The aortic valve is tricuspid. Aortic valve regurgitation is not visualized. No aortic stenosis is present. 5. The inferior vena cava is normal in size with greater than 50%  respiratory variability, suggesting right atrial pressure of 3 mmHg.  FINDINGS Left Ventricle: Left ventricular ejection fraction, by estimation, is 60 to 65%. The left ventricle has normal function. The left ventricle has no regional wall motion abnormalities. The left ventricular internal cavity size was normal in size. There is no left ventricular hypertrophy. Left ventricular diastolic parameters were normal.  Right Ventricle: The right ventricular size is normal. No increase in right ventricular wall thickness. Right ventricular systolic function is normal. There is normal pulmonary artery systolic pressure. The  tricuspid regurgitant velocity is 1.94 m/s, and with an assumed right atrial pressure of 3 mmHg, the estimated right ventricular systolic pressure is 18.1 mmHg.  Left Atrium: Left atrial size was normal in size.  Right Atrium: Right atrial size was normal in size.  Pericardium: There is no evidence of pericardial effusion.  Mitral Valve: The mitral valve is normal in structure. Mild mitral valve regurgitation, with centrally-directed jet. No evidence of mitral valve stenosis.  Tricuspid Valve: The tricuspid valve is normal in structure. Tricuspid valve regurgitation is trivial. No evidence of tricuspid stenosis.  Aortic Valve: The aortic valve is tricuspid. Aortic valve regurgitation is not visualized. No aortic stenosis is present. Aortic valve mean gradient measures 3.0 mmHg. Aortic valve peak gradient measures 4.8 mmHg. Aortic valve area, by VTI measures 2.36 cm.  Pulmonic Valve: The pulmonic valve was normal in structure. Pulmonic valve regurgitation is not visualized. No evidence of pulmonic stenosis.  Aorta: The aortic root is normal in size and structure.  Venous: The inferior vena cava is normal in size with greater than 50% respiratory variability, suggesting right atrial pressure of 3 mmHg.  IAS/Shunts: No atrial level shunt detected by color flow Doppler.   LEFT VENTRICLE PLAX 2D LVIDd:         5.00 cm   Diastology LVIDs:         3.40 cm   LV e' medial:    8.16 cm/s LV PW:         0.80 cm   LV E/e' medial:  10.3 LV IVS:        0.80 cm   LV e' lateral:   11.70 cm/s LVOT diam:     1.90 cm   LV E/e' lateral: 7.2 LV SV:         59 LV SV Index:   37 LVOT Area:     2.84 cm   RIGHT VENTRICLE             IVC RV Basal diam:  3.70 cm     IVC diam: 1.70 cm RV Mid diam:    2.90 cm RV S prime:     13.40 cm/s TAPSE (M-mode): 2.2 cm RVSP:           18.1 mmHg  LEFT ATRIUM             Index        RIGHT ATRIUM           Index LA Vol (A2C):   57.6 ml 35.92 ml/m  RA Pressure:  3.00 mmHg LA Vol (A4C):   35.0 ml 21.83 ml/m  RA Area:     15.80 cm LA Biplane Vol: 47.8 ml 29.81 ml/m  RA Volume:   39.10 ml  24.39 ml/m AORTIC VALVE AV Area (Vmax):    2.63 cm AV Area (Vmean):   2.38 cm AV Area (VTI):     2.36 cm AV Vmax:  109.00 cm/s AV Vmean:          76.100 cm/s AV VTI:            0.251 m AV Peak Grad:      4.8 mmHg AV Mean Grad:      3.0 mmHg LVOT Vmax:         101.00 cm/s LVOT Vmean:        63.900 cm/s LVOT VTI:          0.209 m LVOT/AV VTI ratio: 0.83  AORTA Ao Root diam: 3.20 cm Ao Asc diam:  3.45 cm  MITRAL VALVE               TRICUSPID VALVE MV Area (PHT): 3.68 cm    TR Peak grad:   15.1 mmHg MV Decel Time: 206 msec    TR Vmax:        194.00 cm/s MV E velocity: 83.80 cm/s  Estimated RAP:  3.00 mmHg MV A velocity: 73.40 cm/s  RVSP:           18.1 mmHg MV E/A ratio:  1.14 SHUNTS Systemic VTI:  0.21 m Systemic Diam: 1.90 cm  Jerel Croitoru MD Electronically signed by Jerel Balding MD Signature Date/Time: 06/13/2024/2:59:55 PM    Final      CT SCANS  CT CARDIAC SCORING (SELF PAY ONLY) 06/13/2024  Addendum 06/13/2024  6:39 PM ADDENDUM REPORT: 06/13/2024 18:36  EXAM: OVER-READ INTERPRETATION  CT CHEST  The following report is an over-read performed by radiologist Dr. Andrea Gasman of [facility] on 06/13/2024. This over-read does not include interpretation of cardiac or coronary anatomy or pathology. The coronary calcium score interpretation by the cardiologist is attached.  COMPARISON:  None.  FINDINGS: Vascular: No aortic atherosclerosis. The included aorta is normal in caliber.  Mediastinum/nodes: No adenopathy or mass. Unremarkable esophagus.  Lungs: No focal airspace disease. No pulmonary nodule. No pleural fluid. The included airways are patent.  Upper abdomen: No acute or unexpected findings.  Musculoskeletal: There are no acute or suspicious osseous abnormalities.  IMPRESSION: No acute  or unexpected extracardiac findings.   Electronically Signed By: Andrea Gasman M.D. On: 06/13/2024 18:36  Narrative CLINICAL DATA:  Cardiovascular Disease Risk stratification  EXAM: Coronary Calcium Score  TECHNIQUE: A gated, non-contrast computed tomography scan of the heart was performed using 3mm slice thickness. Axial images were analyzed on a dedicated workstation. Calcium scoring of the coronary arteries was performed using the Agatston method.  FINDINGS: Coronary arteries: Normal origins.  Coronary Calcium Score:  Left main: 0  Left anterior descending artery: 0  Left circumflex artery: 0  Right coronary artery: 0  Total: 0  Percentile:  Pericardium: Normal.  Ascending Aorta: Normal caliber.  Non-cardiac: See separate report from [facility].  IMPRESSION: Coronary calcium score of 0.  RECOMMENDATIONS: Coronary artery calcium (CAC) score is a strong predictor of incident coronary heart disease (CHD) and provides predictive information beyond traditional risk factors. CAC scoring is reasonable to use in the decision to withhold, postpone, or initiate statin therapy in intermediate-risk or selected borderline-risk asymptomatic adults (age 77-75 years and LDL-C >=70 to <190 mg/dL) who do not have diabetes or established atherosclerotic cardiovascular disease (ASCVD).* In intermediate-risk (10-year ASCVD risk >=7.5% to <20%) adults or selected borderline-risk (10-year ASCVD risk >=5% to <7.5%) adults in whom a CAC score is measured for the purpose of making a treatment decision the following recommendations have been made:  If CAC=0, it is reasonable to withhold statin  therapy and reassess in 5 to 10 years, as long as higher risk conditions are absent (diabetes mellitus, family history of premature CHD in first degree relatives (males <55 years; females <65 years), cigarette smoking, or LDL >=190 mg/dL).  If CAC is 1 to 99, it is  reasonable to initiate statin therapy for patients >=14 years of age.  If CAC is >=100 or >=75th percentile, it is reasonable to initiate statin therapy at any age.  Cardiology referral should be considered for patients with CAC scores >=400 or >=75th percentile.  *2018 AHA/ACC/AACVPR/AAPA/ABC/ACPM/ADA/AGS/APhA/ASPC/NLA/PCNA Guideline on the Management of Blood Cholesterol: A Report of the American College of Cardiology/American Heart Association Task Force on Clinical Practice Guidelines. J Am Coll Cardiol. 2019;73(24):3168-3209.  Lonni Nanas, MD  Electronically Signed: By: Lonni Nanas M.D. On: 06/13/2024 16:10     ______________________________________________________________________________________________       Risk Assessment/Calculations           Physical Exam VS:  BP 116/82   Pulse 78   Ht 5' 2 (1.575 m)   Wt 136 lb 9.6 oz (62 kg)   SpO2 98%   BMI 24.98 kg/m        Wt Readings from Last 3 Encounters:  07/15/24 136 lb 9.6 oz (62 kg)  06/02/24 135 lb (61.2 kg)  05/26/24 135 lb 3.2 oz (61.3 kg)    GEN: Well nourished, well developed in no acute distress. Sitting comfortably in the chair  NECK: No JVD  CARDIAC:  RRR, no murmurs, rubs, gallops. Radial pulses 2+ bilaterally  RESPIRATORY:  Clear to auscultation without rales, wheezing or rhonchi. Normal WOB on room air   ABDOMEN: Soft, non-tender, non-distended EXTREMITIES:  No edema in BLE; No deformity   ASSESSMENT AND PLAN   Abnormal EKG  Mild MR  - Patient was referred to Dr. Loni and seen 7/31- EKG showed minimal criteria for LVH  - Echocardiogram 05/2024  showed EF 60-65%, no regional wall motion abnormalities, normal diastolic parameters, normal RV systolic function, mild MR. No LVH noted  - Denies chest pain, shortness of breath, palpitations, dizziness, syncope, near syncope, lower extremity edema. No cardiac symptoms  - BP has been well controlled. OK to remain off benicar    - No further workup indicated at this time. Recommended repeating echo in 5 years for mild MR   HLD  - Lipid panel from 12/2023 showed LDL 97, HDL 87, triglycerides 31, total cholesterol 191  - Coronary calcium score 0 in 05/2024  - Ordered repeat lipid panel to be collected when patient is fasting  - Continue dietary management    Dispo: Follow up with Dr. Acharya as needed   Signed, Rollo FABIENE Louder, PA-C

## 2024-07-15 ENCOUNTER — Encounter: Payer: Self-pay | Admitting: Cardiology

## 2024-07-15 ENCOUNTER — Ambulatory Visit: Attending: Cardiology | Admitting: Cardiology

## 2024-07-15 VITALS — BP 116/82 | HR 78 | Ht 62.0 in | Wt 136.6 lb

## 2024-07-15 DIAGNOSIS — E785 Hyperlipidemia, unspecified: Secondary | ICD-10-CM | POA: Diagnosis not present

## 2024-07-15 DIAGNOSIS — Z7189 Other specified counseling: Secondary | ICD-10-CM | POA: Diagnosis not present

## 2024-07-15 DIAGNOSIS — R9431 Abnormal electrocardiogram [ECG] [EKG]: Secondary | ICD-10-CM | POA: Diagnosis not present

## 2024-07-15 NOTE — Patient Instructions (Signed)
 Medication Instructions:  Your physician recommends that you continue on your current medications as directed. Please refer to the Current Medication list given to you today.  *If you need a refill on your cardiac medications before your next appointment, please call your pharmacy*  Lab Work: FASTING LIPIDS LATE OCTOBER If you have labs (blood work) drawn today and your tests are completely normal, you will receive your results only by: MyChart Message (if you have MyChart) OR A paper copy in the mail If you have any lab test that is abnormal or we need to change your treatment, we will call you to review the results.  Follow-Up: As needed    Provider:   Gayatri A Acharya, MD

## 2024-09-09 LAB — LIPID PANEL
Chol/HDL Ratio: 2.5 ratio (ref 0.0–4.4)
Cholesterol, Total: 185 mg/dL (ref 100–199)
HDL: 75 mg/dL (ref >39)
LDL Chol Calc (NIH): 101 mg/dL — ABNORMAL HIGH (ref 0–99)
Triglycerides: 46 mg/dL (ref 0–149)
VLDL Cholesterol Cal: 9 mg/dL (ref 5–40)

## 2024-09-12 ENCOUNTER — Ambulatory Visit: Payer: Self-pay | Admitting: Cardiology

## 2024-09-14 ENCOUNTER — Other Ambulatory Visit: Payer: Self-pay | Admitting: Hematology and Oncology

## 2024-09-14 ENCOUNTER — Other Ambulatory Visit (HOSPITAL_COMMUNITY): Payer: Self-pay | Admitting: Diagnostic Radiology

## 2024-09-14 ENCOUNTER — Ambulatory Visit
Admission: RE | Admit: 2024-09-14 | Discharge: 2024-09-14 | Disposition: A | Discharge status: Home / Self Care | Source: Ambulatory Visit | Attending: Hematology and Oncology | Admitting: Hematology and Oncology

## 2024-09-14 ENCOUNTER — Ambulatory Visit
Admission: RE | Admit: 2024-09-14 | Discharge: 2024-09-14 | Disposition: A | Source: Ambulatory Visit | Attending: Hematology and Oncology | Admitting: Hematology and Oncology

## 2024-09-14 DIAGNOSIS — R928 Other abnormal and inconclusive findings on diagnostic imaging of breast: Secondary | ICD-10-CM

## 2024-09-14 DIAGNOSIS — Z9189 Other specified personal risk factors, not elsewhere classified: Secondary | ICD-10-CM

## 2024-09-14 DIAGNOSIS — N6320 Unspecified lump in the left breast, unspecified quadrant: Secondary | ICD-10-CM

## 2024-09-14 HISTORY — PX: BREAST BIOPSY: SHX20

## 2024-09-14 MED ORDER — IOPAMIDOL (ISOVUE-370) INJECTION 76%
100.0000 mL | Freq: Once | INTRAVENOUS | Status: AC | PRN
  Administered 2024-09-14: 100 mL via INTRAVENOUS

## 2024-09-15 LAB — SURGICAL PATHOLOGY

## 2025-06-01 ENCOUNTER — Ambulatory Visit: Admitting: Hematology and Oncology
